# Patient Record
Sex: Female | Born: 1945 | Hispanic: No | Marital: Single | Smoking: Never smoker
Health system: Southern US, Community
[De-identification: ages and names within clinical notes are randomized; demographics above are authoritative.]

## PROBLEM LIST (undated history)

## (undated) DIAGNOSIS — N200 Calculus of kidney: Secondary | ICD-10-CM

## (undated) DIAGNOSIS — E119 Type 2 diabetes mellitus without complications: Secondary | ICD-10-CM

## (undated) DIAGNOSIS — I10 Essential (primary) hypertension: Secondary | ICD-10-CM

---

## 2000-11-28 HISTORY — PX: ABDOMINAL HYSTERECTOMY: SHX81

## 2015-11-18 ENCOUNTER — Other Ambulatory Visit: Payer: Self-pay | Admitting: Internal Medicine

## 2015-11-18 DIAGNOSIS — Z1231 Encounter for screening mammogram for malignant neoplasm of breast: Secondary | ICD-10-CM

## 2015-12-02 ENCOUNTER — Ambulatory Visit: Payer: Self-pay | Admitting: General Surgery

## 2015-12-03 ENCOUNTER — Ambulatory Visit
Admission: RE | Admit: 2015-12-03 | Discharge: 2015-12-03 | Disposition: A | Discharge status: Home / Self Care | Payer: Medicaid Other | Source: Ambulatory Visit | Attending: Internal Medicine | Admitting: Internal Medicine

## 2015-12-03 DIAGNOSIS — Z1231 Encounter for screening mammogram for malignant neoplasm of breast: Secondary | ICD-10-CM | POA: Diagnosis not present

## 2015-12-07 ENCOUNTER — Other Ambulatory Visit: Payer: Self-pay | Admitting: Internal Medicine

## 2015-12-07 DIAGNOSIS — R928 Other abnormal and inconclusive findings on diagnostic imaging of breast: Secondary | ICD-10-CM

## 2015-12-10 ENCOUNTER — Encounter: Payer: Self-pay | Admitting: General Surgery

## 2015-12-10 ENCOUNTER — Ambulatory Visit (INDEPENDENT_AMBULATORY_CARE_PROVIDER_SITE_OTHER): Payer: Medicaid Other | Admitting: General Surgery

## 2015-12-10 VITALS — BP 148/86 | HR 80 | Resp 12 | Ht 59.0 in | Wt 151.0 lb

## 2015-12-10 DIAGNOSIS — Z800 Family history of malignant neoplasm of digestive organs: Secondary | ICD-10-CM

## 2015-12-10 DIAGNOSIS — Z1211 Encounter for screening for malignant neoplasm of colon: Secondary | ICD-10-CM | POA: Diagnosis not present

## 2015-12-10 MED ORDER — POLYETHYLENE GLYCOL 3350 17 GM/SCOOP PO POWD: ORAL | Status: DC

## 2015-12-10 NOTE — Patient Instructions (Addendum)
Colonoscopy A colonoscopy is an exam to look at the entire large intestine (colon). This exam can help find problems such as tumors, polyps, inflammation, and areas of bleeding. The exam takes about 1 hour.  LET [facility] PROVIDER KNOW ABOUT:   Any allergies you have.  All medicines you are taking, including vitamins, herbs, eye drops, creams, and over-the-counter medicines.  Previous problems you or members of your family have had with the use of anesthetics.  Any blood disorders you have.  Previous surgeries you have had.  Medical conditions you have. RISKS AND COMPLICATIONS  Generally, this is a safe procedure. However, as with any procedure, complications can occur. Possible complications include:  Bleeding.  Tearing or rupture of the colon wall.  Reaction to medicines given during the exam.  Infection (rare). BEFORE THE PROCEDURE   Ask your health care provider about changing or stopping your regular medicines.  You may be prescribed an oral bowel prep. This involves drinking a large amount of medicated liquid, starting the day before your procedure. The liquid will cause you to have multiple loose stools until your stool is almost clear or light green. This cleans out your colon in preparation for the procedure.  Do not eat or drink anything else once you have started the bowel prep, unless your health care provider tells you it is safe to do so.  Arrange for someone to drive you home after the procedure. PROCEDURE   You will be given medicine to help you relax (sedative).  You will lie on your side with your knees bent.  A long, flexible tube with a light and camera on the end (colonoscope) will be inserted through the rectum and into the colon. The camera sends video back to a computer screen as it moves through the colon. The colonoscope also releases carbon dioxide gas to inflate the colon. This helps your health care provider see the area better.  During  the exam, your health care provider may take a small tissue sample (biopsy) to be examined under a microscope if any abnormalities are found.  The exam is finished when the entire colon has been viewed. AFTER THE PROCEDURE   Do not drive for 24 hours after the exam.  You may have a small amount of blood in your stool.  You may pass moderate amounts of gas and have mild abdominal cramping or bloating. This is caused by the gas used to inflate your colon during the exam.  Ask when your test results will be ready and how you will get your results. Make sure you get your test results.   This information is not intended to replace advice given to you by your health care provider. Make sure you discuss any questions you have with your health care provider.   Document Released: 11/11/2000 Document Revised: 09/04/2013 Document Reviewed: 07/22/2013 Elsevier Interactive Patient Education [employer]2016 [employer].     Patient [facility] Patient has been asked to hold Metformin and Kombiglize day of colonoscopy prep and procedure. She may take glipizide day of colonoscopy prep. Patient will only take Losartan day of colonoscopy at 6 am with a sip of water.

## 2015-12-10 NOTE — Progress Notes (Signed)
Patient ID: Jocelyn Vazquez, female   DOB: May 16, 1946, 70 y.o.   MRN: [id number]  Chief Complaint  Patient presents with  . Colonoscopy    HPI Jocelyn Vazquez is a 70 y.o. female.  Here today to discuss having a colonoscopy, none prior. Bowels move daily and no bleeding. Her mother had colon cancer at age 11. I have reviewed the history of present illness with the patient. Interpreter, Mahin, present for interview, exam and discussion.  HPI  History reviewed. No pertinent past medical history.  Past Surgical History  Procedure Laterality Date  . Abdominal hysterectomy  2002    Family History  Problem Relation Age of Onset  . Breast cancer Neg Hx   . Colon cancer Mother 55    Social History Social History  Substance Use Topics  . Smoking status: Never Smoker   . Smokeless tobacco: Never Used  . Alcohol Use: No    No Known Allergies  Current Outpatient Prescriptions  Medication Sig Dispense Refill  . aspirin 81 MG tablet Take 81 mg by mouth daily.    Marland Kitchen glipiZIDE (GLUCOTROL XL) 5 MG 24 hr tablet TK 1 T PO BID  0  . KOMBIGLYZE XR 03-999 MG TB24 TK 1 T PO QD  0  . losartan (COZAAR) 50 MG tablet TK 1 T PO QD  0  . lovastatin (MEVACOR) 20 MG tablet TK 1 T PO QD  0  . metFORMIN (GLUCOPHAGE) 1000 MG tablet TK 1 T PO QD  0   No current facility-administered medications for this visit.    Review of Systems Review of Systems  Constitutional: Negative.   Respiratory: Negative.   Cardiovascular: Negative.   Gastrointestinal: Negative for nausea, vomiting, diarrhea and constipation.    Blood pressure 148/86, pulse 80, resp. rate 12, height  (1.499 m), weight 151 lb (68.493 kg).  Physical Exam Physical Exam  Constitutional: She is oriented to person, place, and time. She appears well-developed and well-nourished.  HENT:  Mouth/Throat: Oropharynx is clear and moist.  Eyes: Conjunctivae are normal. No scleral icterus.  Neck: Neck supple.   Cardiovascular: Normal rate, regular rhythm and normal heart sounds.   Pulmonary/Chest: Effort normal and breath sounds normal.  Abdominal: Soft. Normal appearance and bowel sounds are normal. There is no tenderness.  Lymphadenopathy:    She has no cervical adenopathy.  Neurological: She is alert and oriented to person, place, and time.  Skin: Skin is warm and dry.  Psychiatric: Her behavior is normal.    Data Reviewed Progress notes.  Assessment    Stable physical exam. Family history of colon cancer. She appears to be in very good health, has higher than average risk of colon CA. Feel it is reasonable to perform colonoscopy. This was discussed fully with pt via interpreter. Pt is agreeable.    Plan    Colonoscopy with possible biopsy/polypectomy prn: Information regarding the procedure, including its potential risks and complications (including but not limited to perforation of the bowel, which may require emergency surgery to repair, and bleeding) was verbally given to the patient. Educational information regarding lower intestinal endoscopy was given to the patient. Written instructions for how to complete the bowel prep using Miralax were provided. The importance of drinking ample fluids to avoid dehydration as a result of the prep emphasized.     Patient has been scheduled for a colonoscopy on 01-06-16 at [facility]. Patient has been asked to hold Metformin and Kombiglize day of colonoscopy prep and procedure.  She may take glipizide day of colonoscopy prep. Patient will only take Losartan day of colonoscopy at 6 am with a sip of water.    PCP:  Sherrie MustacheJadali, Fayegh  This information has been scribed by Dorathy DaftMarsha Hatch RNBC.  SANKAR,SEEPLAPUTHUR G 12/10/2015, 9:34 AM

## 2016-01-06 ENCOUNTER — Encounter: Payer: Self-pay | Admitting: *Deleted

## 2016-01-06 ENCOUNTER — Ambulatory Visit
Admission: RE | Admit: 2016-01-06 | Discharge: 2016-01-06 | Disposition: A | Discharge status: Home / Self Care | Payer: Medicaid Other | Source: Ambulatory Visit | Attending: General Surgery | Admitting: General Surgery

## 2016-01-06 ENCOUNTER — Ambulatory Visit: Payer: Medicaid Other | Admitting: Anesthesiology

## 2016-01-06 ENCOUNTER — Encounter
Admission: RE | Disposition: A | Discharge status: Home / Self Care | Payer: Self-pay | Source: Ambulatory Visit | Attending: General Surgery

## 2016-01-06 DIAGNOSIS — Z7984 Long term (current) use of oral hypoglycemic drugs: Secondary | ICD-10-CM | POA: Diagnosis not present

## 2016-01-06 DIAGNOSIS — Z800 Family history of malignant neoplasm of digestive organs: Secondary | ICD-10-CM | POA: Diagnosis not present

## 2016-01-06 DIAGNOSIS — K644 Residual hemorrhoidal skin tags: Secondary | ICD-10-CM | POA: Insufficient documentation

## 2016-01-06 DIAGNOSIS — Z7982 Long term (current) use of aspirin: Secondary | ICD-10-CM | POA: Insufficient documentation

## 2016-01-06 DIAGNOSIS — Z90710 Acquired absence of both cervix and uterus: Secondary | ICD-10-CM | POA: Diagnosis not present

## 2016-01-06 DIAGNOSIS — Z1211 Encounter for screening for malignant neoplasm of colon: Secondary | ICD-10-CM | POA: Insufficient documentation

## 2016-01-06 HISTORY — DX: Type 2 diabetes mellitus without complications: E11.9

## 2016-01-06 HISTORY — DX: Calculus of kidney: N20.0

## 2016-01-06 HISTORY — DX: Essential (primary) hypertension: I10

## 2016-01-06 HISTORY — PX: COLONOSCOPY WITH PROPOFOL: SHX5780

## 2016-01-06 SURGERY — COLONOSCOPY WITH PROPOFOL
Anesthesia: General

## 2016-01-06 MED ORDER — PROPOFOL 10 MG/ML IV BOLUS
INTRAVENOUS | Status: DC | PRN
  Administered 2016-01-06: 50 mg via INTRAVENOUS

## 2016-01-06 MED ORDER — MIDAZOLAM HCL 5 MG/5ML IJ SOLN
INTRAMUSCULAR | Status: DC | PRN
  Administered 2016-01-06: 1 mg via INTRAVENOUS

## 2016-01-06 MED ORDER — PROPOFOL 500 MG/50ML IV EMUL
INTRAVENOUS | Status: DC | PRN
  Administered 2016-01-06: 140 ug/kg/min via INTRAVENOUS

## 2016-01-06 MED ORDER — FENTANYL CITRATE (PF) 100 MCG/2ML IJ SOLN
INTRAMUSCULAR | Status: DC | PRN
  Administered 2016-01-06: 50 ug via INTRAVENOUS

## 2016-01-06 MED ORDER — SODIUM CHLORIDE 0.9 % IV SOLN
INTRAVENOUS | Status: DC
  Administered 2016-01-06: 09:00:00 via INTRAVENOUS
  Administered 2016-01-06: 1000 mL via INTRAVENOUS

## 2016-01-06 NOTE — Anesthesia Preprocedure Evaluation (Signed)
Anesthesia Evaluation  Patient identified by MRN, date of birth, ID band Patient awake    Reviewed: Allergy & Precautions, NPO status , Patient's Chart, lab work & pertinent test results  Airway Mallampati: II  TM Distance: <3 FB Neck ROM: Full    Dental no notable dental hx. (+) Caps   Pulmonary neg pulmonary ROS,    Pulmonary exam normal breath sounds clear to auscultation       Cardiovascular hypertension, Normal cardiovascular exam     Neuro/Psych negative neurological ROS  negative psych ROS   GI/Hepatic negative GI ROS, Neg liver ROS, Patient received Oral Contrast Agents,  Endo/Other  diabetes, Well Controlled, Type 2, Oral Hypoglycemic Agents  Renal/GU stones  negative genitourinary   Musculoskeletal negative musculoskeletal ROS (+)   Abdominal Normal abdominal exam  (+)   Peds negative pediatric ROS (+)  Hematology negative hematology ROS (+)   Anesthesia Other Findings   Reproductive/Obstetrics                             Anesthesia Physical Anesthesia Plan  ASA: II  Anesthesia Plan: General   Post-op Pain Management:    Induction: Intravenous  Airway Management Planned: Nasal Cannula  Additional Equipment:   Intra-op Plan:   Post-operative Plan:   Informed Consent: I have reviewed the patients History and Physical, chart, labs and discussed the procedure including the risks, benefits and alternatives for the proposed anesthesia with the patient or authorized representative who has indicated his/her understanding and acceptance.   Dental advisory given  Plan Discussed with: CRNA and Surgeon  Anesthesia Plan Comments:         Anesthesia Quick Evaluation

## 2016-01-06 NOTE — Anesthesia Procedure Notes (Signed)
Date/Time: 01/06/2016 9:24 AM Performed by: Henrietta Hoover Pre-anesthesia Checklist: Patient identified, Emergency Drugs available, Suction available, Patient being monitored and Timeout performed Patient Re-evaluated:Patient Re-evaluated prior to inductionOxygen Delivery Method: Nasal cannula Preoxygenation: Pre-oxygenation with 100% oxygen Intubation Type: IV induction Comments: Spontaneous resp throughout

## 2016-01-06 NOTE — Transfer of Care (Signed)
Immediate Anesthesia Transfer of Care Note  Patient: Jocelyn Vazquez  Procedure(s) Performed: Procedure(s): COLONOSCOPY WITH PROPOFOL (N/A)  Patient Location: PACU  Anesthesia Type:General  Level of Consciousness: sedated  Airway & Oxygen Therapy: Patient Spontanous Breathing and Patient connected to nasal cannula oxygen  Post-op Assessment: Report given to RN and Post -op Vital signs reviewed and stable  Post vital signs: Reviewed and stable  Last Vitals:  Filed Vitals:   01/06/16 0835 01/06/16 0940  BP: 160/85   Pulse: 81   Temp: 36.2 C 36 C  Resp: 16     Complications: No apparent anesthesia complications

## 2016-01-06 NOTE — H&P (View-Only) (Signed)
Patient ID: Jocelyn Vazquez, female   DOB: May 16, 1946, 70 y.o.   MRN: [id number]  Chief Complaint  Patient presents with  . Colonoscopy    HPI Jocelyn Vazquez is a 70 y.o. female.  Here today to discuss having a colonoscopy, none prior. Bowels move daily and no bleeding. Her mother had colon cancer at age 11. I have reviewed the history of present illness with the patient. Interpreter, Mahin, present for interview, exam and discussion.  HPI  History reviewed. No pertinent past medical history.  Past Surgical History  Procedure Laterality Date  . Abdominal hysterectomy  2002    Family History  Problem Relation Age of Onset  . Breast cancer Neg Hx   . Colon cancer Mother 55    Social History Social History  Substance Use Topics  . Smoking status: Never Smoker   . Smokeless tobacco: Never Used  . Alcohol Use: No    No Known Allergies  Current Outpatient Prescriptions  Medication Sig Dispense Refill  . aspirin 81 MG tablet Take 81 mg by mouth daily.    Marland Kitchen glipiZIDE (GLUCOTROL XL) 5 MG 24 hr tablet TK 1 T PO BID  0  . KOMBIGLYZE XR 03-999 MG TB24 TK 1 T PO QD  0  . losartan (COZAAR) 50 MG tablet TK 1 T PO QD  0  . lovastatin (MEVACOR) 20 MG tablet TK 1 T PO QD  0  . metFORMIN (GLUCOPHAGE) 1000 MG tablet TK 1 T PO QD  0   No current facility-administered medications for this visit.    Review of Systems Review of Systems  Constitutional: Negative.   Respiratory: Negative.   Cardiovascular: Negative.   Gastrointestinal: Negative for nausea, vomiting, diarrhea and constipation.    Blood pressure 148/86, pulse 80, resp. rate 12, height  (1.499 m), weight 151 lb (68.493 kg).  Physical Exam Physical Exam  Constitutional: She is oriented to person, place, and time. She appears well-developed and well-nourished.  HENT:  Mouth/Throat: Oropharynx is clear and moist.  Eyes: Conjunctivae are normal. No scleral icterus.  Neck: Neck supple.   Cardiovascular: Normal rate, regular rhythm and normal heart sounds.   Pulmonary/Chest: Effort normal and breath sounds normal.  Abdominal: Soft. Normal appearance and bowel sounds are normal. There is no tenderness.  Lymphadenopathy:    She has no cervical adenopathy.  Neurological: She is alert and oriented to person, place, and time.  Skin: Skin is warm and dry.  Psychiatric: Her behavior is normal.    Data Reviewed Progress notes.  Assessment    Stable physical exam. Family history of colon cancer. She appears to be in very good health, has higher than average risk of colon CA. Feel it is reasonable to perform colonoscopy. This was discussed fully with pt via interpreter. Pt is agreeable.    Plan    Colonoscopy with possible biopsy/polypectomy prn: Information regarding the procedure, including its potential risks and complications (including but not limited to perforation of the bowel, which may require emergency surgery to repair, and bleeding) was verbally given to the patient. Educational information regarding lower intestinal endoscopy was given to the patient. Written instructions for how to complete the bowel prep using Miralax were provided. The importance of drinking ample fluids to avoid dehydration as a result of the prep emphasized.     Patient has been scheduled for a colonoscopy on 01-06-16 at [facility]. Patient has been asked to hold Metformin and Kombiglize day of colonoscopy prep and procedure.  She may take glipizide day of colonoscopy prep. Patient will only take Losartan day of colonoscopy at 6 am with a sip of water.    PCP:  Sherrie Mustache  This information has been scribed by Dorathy Daft RNBC.  SANKAR,SEEPLAPUTHUR G 12/10/2015, 9:34 AM

## 2016-01-06 NOTE — Interval H&P Note (Signed)
History and Physical Interval Note:  01/06/2016 8:26 AM  Jocelyn Vazquez  has presented today for surgery, with the diagnosis of FAM HX  The various methods of treatment have been discussed with the patient and family. After consideration of risks, benefits and other options for treatment, the patient has consented to  Procedure(s): COLONOSCOPY WITH PROPOFOL (N/A) as a surgical intervention .  The patient's history has been reviewed, patient examined, no change in status, stable for surgery.  I have reviewed the patient's chart and labs.  Questions were answered to the patient's satisfaction.     SANKAR,SEEPLAPUTHUR G

## 2016-01-06 NOTE — Op Note (Signed)
[facility] Gastroenterology Patient Name: Jocelyn Vazquez [facility] Procedure Date: 01/06/2016 9:07 AM MRN: [id number] Account #: [id number] Date of Birth: [birth year] Admit Type: Outpatient Age: 70 Room: [facility] ENDO ROOM 1 Gender: Female Note Status: Finalized Procedure:         Colonoscopy Indications:       Screening in patient at increased risk: Family history of                     1st-degree relative with colorectal cancer before age 33                     years Providers:         Seeplaputhur G. Evette Cristal, MD Referring MD:      Sherrie Mustache, MD (Referring MD) Medicines:         General Anesthesia Complications:     No immediate complications. Procedure:         Pre-Anesthesia Assessment:                    - General anesthesia under the supervision of an                     anesthesiologist was determined to be medically necessary                     for this procedure based on review of the patient's                     medical history, medications, and prior anesthesia history.                    After obtaining informed consent, the colonoscope was                     passed under direct vision. Throughout the procedure, the                     patient's blood pressure, pulse, and oxygen saturations                     were monitored continuously. The Olympus CF-Q160AL                     colonoscope (S#. [id number]) was introduced through the anus                     and advanced to the the cecum, identified by the ileocecal                     valve. The colonoscopy was performed without difficulty.                     The patient tolerated the procedure well. The quality of                     the bowel preparation was excellent. Findings:      The perianal exam findings include non-thrombosed external hemorrhoids.      The exam was otherwise without abnormality on direct and retroflexion       views. Impression:        - Non-thrombosed external hemorrhoids  found on perianal                     exam.                    -  The examination was otherwise normal on direct and                     retroflexion views.                    - No specimens collected. Recommendation:    - Discharge patient to home.                    - Repeat colonoscopy in 5 years for surveillance. Procedure Code(s): --- Professional ---                    [phone], Colonoscopy, flexible; diagnostic, including                     collection of specimen(s) by brushing or washing, when                     performed (separate procedure) Diagnosis Code(s): --- Professional ---                    Z80.0, Family history of malignant neoplasm of digestive                     organs                    K64.4, Residual hemorrhoidal skin tags CPT copyright 2014 American Medical Association. All rights reserved. The codes documented in this report are preliminary and upon coder review may  be revised to meet current compliance requirements. Kieth Brightly, MD 01/06/2016 9:40:33 AM This report has been signed electronically. Number of Addenda: 0 Note Initiated On: 01/06/2016 9:07 AM Scope Withdrawal Time: 0 hours 4 minutes 41 seconds  Total Procedure Duration: 0 hours 23 minutes 1 second       [facility]

## 2016-01-07 NOTE — Anesthesia Postprocedure Evaluation (Signed)
Anesthesia Post Note  Patient: Jocelyn Vazquez  Procedure(s) Performed: Procedure(s) (LRB): COLONOSCOPY WITH PROPOFOL (N/A)  Patient location during evaluation: PACU Anesthesia Type: General Level of consciousness: awake and alert Pain management: pain level controlled Vital Signs Assessment: post-procedure vital signs reviewed and stable Respiratory status: spontaneous breathing Cardiovascular status: blood pressure returned to baseline Anesthetic complications: no    Last Vitals:  Filed Vitals:   01/06/16 1007 01/06/16 1010  BP: 148/87 148/75  Pulse: 71 72  Temp:    Resp: 16 14    Last Pain:  Filed Vitals:   01/07/16 0732  PainSc: 0-No pain                 Kerry-Anne Mezo

## 2016-01-18 ENCOUNTER — Ambulatory Visit
Admission: RE | Admit: 2016-01-18 | Discharge: 2016-01-18 | Disposition: A | Discharge status: Home / Self Care | Payer: Medicaid Other | Source: Ambulatory Visit | Attending: Internal Medicine | Admitting: Internal Medicine

## 2016-01-18 DIAGNOSIS — R590 Localized enlarged lymph nodes: Secondary | ICD-10-CM | POA: Insufficient documentation

## 2016-01-18 DIAGNOSIS — R928 Other abnormal and inconclusive findings on diagnostic imaging of breast: Secondary | ICD-10-CM | POA: Insufficient documentation

## 2016-01-19 ENCOUNTER — Other Ambulatory Visit: Payer: Self-pay | Admitting: Internal Medicine

## 2016-01-19 DIAGNOSIS — N6320 Unspecified lump in the left breast, unspecified quadrant: Secondary | ICD-10-CM

## 2016-01-28 ENCOUNTER — Ambulatory Visit
Admission: RE | Admit: 2016-01-28 | Discharge: 2016-01-28 | Disposition: A | Discharge status: Home / Self Care | Payer: Medicaid Other | Source: Ambulatory Visit | Attending: Internal Medicine | Admitting: Internal Medicine

## 2016-01-28 DIAGNOSIS — N6320 Unspecified lump in the left breast, unspecified quadrant: Secondary | ICD-10-CM

## 2016-01-28 DIAGNOSIS — N63 Unspecified lump in breast: Secondary | ICD-10-CM | POA: Insufficient documentation

## 2016-01-28 DIAGNOSIS — R591 Generalized enlarged lymph nodes: Secondary | ICD-10-CM | POA: Diagnosis not present

## 2016-01-28 HISTORY — PX: BREAST BIOPSY: SHX20

## 2016-01-29 LAB — SURGICAL PATHOLOGY

## 2016-12-13 IMAGING — MG MM DIGITAL SCREENING BILATERAL
4 series · 4 of 4 positions shown · non-contrast
Comparison: None available.

CLINICAL DATA: Screening.

EXAM:
DIGITAL SCREENING BILATERAL MAMMOGRAM WITH CAD

[L CC]
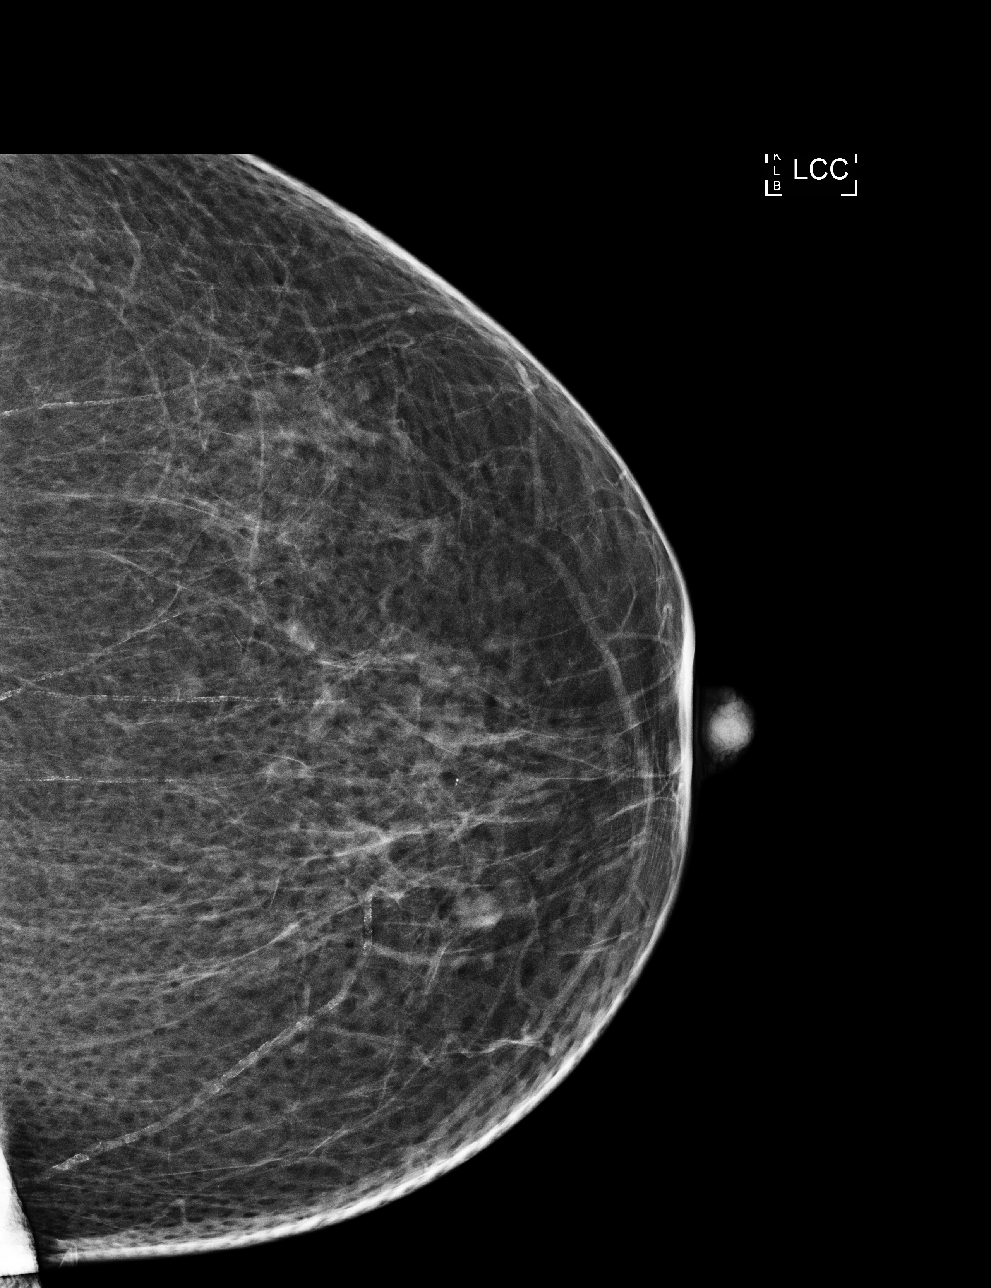

[R CC]
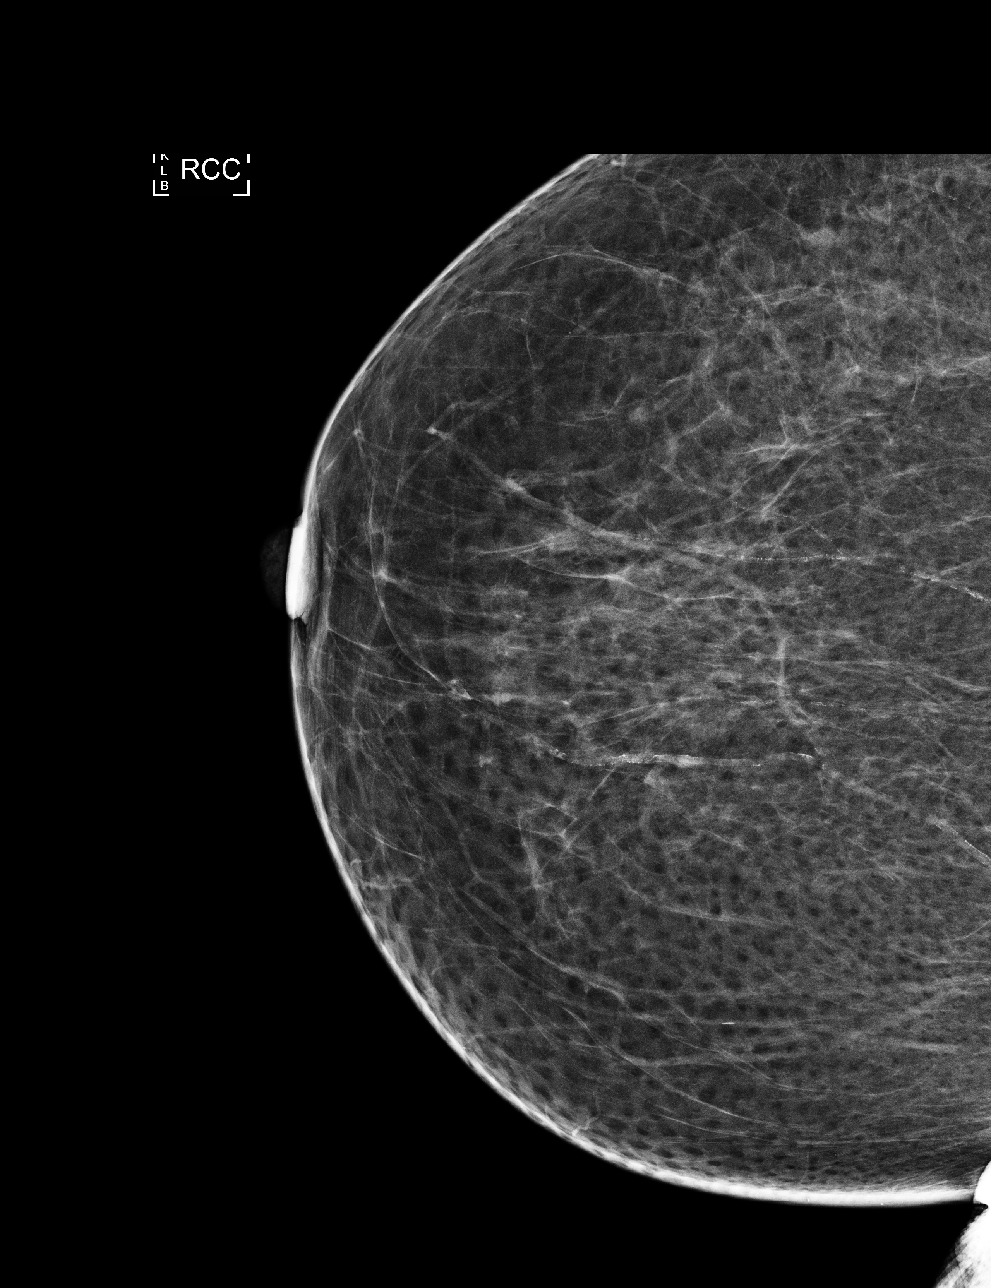

[L MLO]
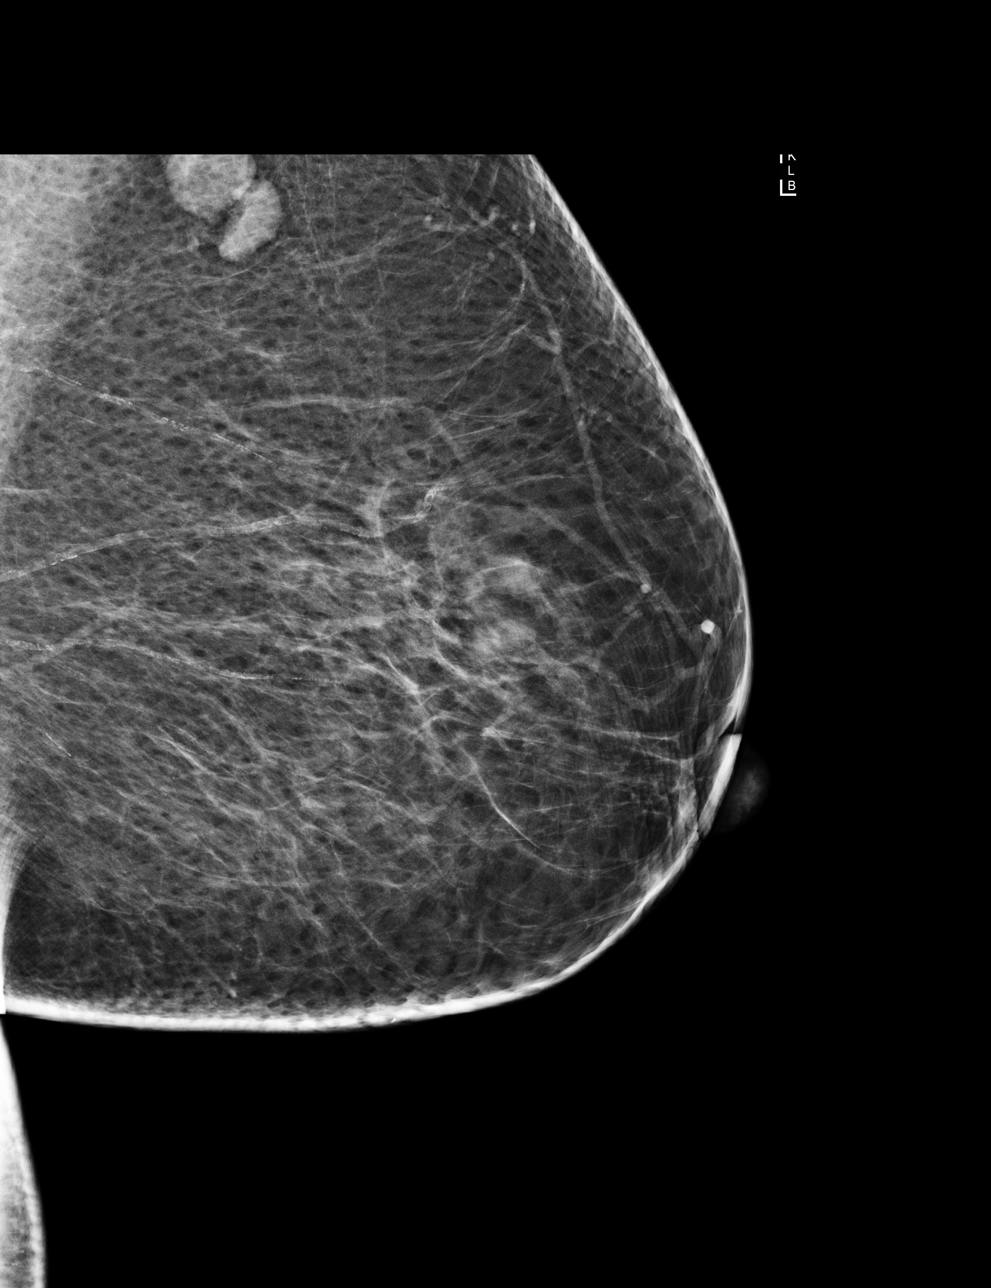

[R MLO]
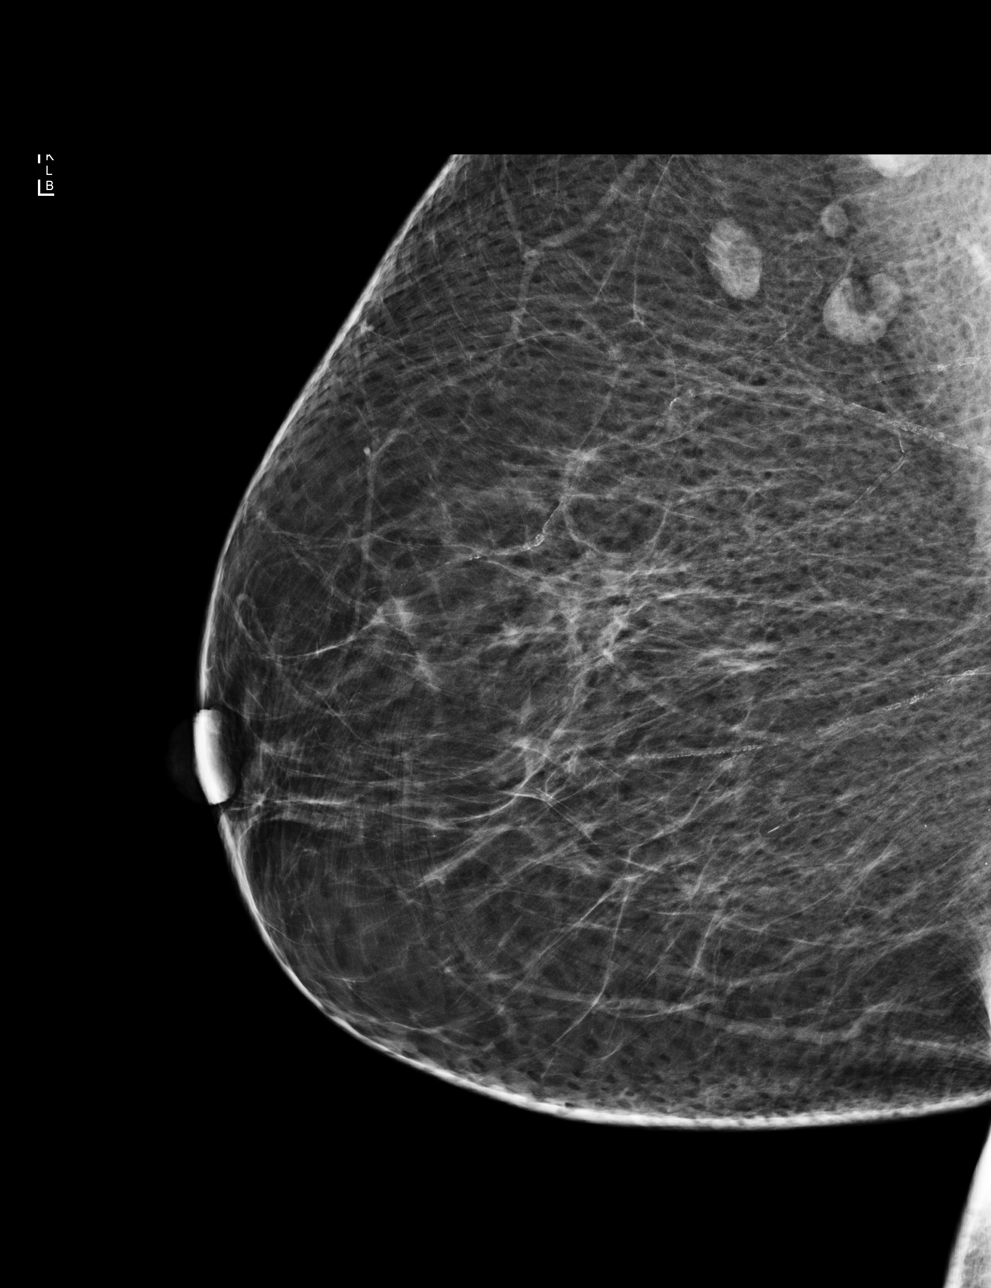

[4 of 4 positions shown; findings below may reference images not displayed]

ACR Breast Density Category b: There are scattered areas of
fibroglandular density.
FINDINGS: In the left breast, a possible mass warrants further evaluation. In
the right breast, no findings suspicious for malignancy.

Images were processed with CAD.
IMPRESSION: Further evaluation is suggested for possible mass in the left
breast.

RECOMMENDATION:
Diagnostic mammogram and possibly ultrasound of the left breast.
(Code:ID-S-IIZ)

The patient will be contacted regarding the findings, and additional
imaging will be scheduled.

BI-RADS CATEGORY  0: Incomplete. Need additional imaging evaluation
and/or prior mammograms for comparison.

## 2017-06-25 IMAGING — US US BREAST*L* LIMITED INC AXILLA
1 series · 13 of 25 positions shown · non-contrast
Comparison: Screening mammogram dated 12/03/2015

CLINICAL DATA: 69-year-old female, callback from screening
mammogram for possible left breast mass

EXAM:
DIGITAL DIAGNOSTIC LEFT MAMMOGRAM WITH 3D TOMOSYNTHESIS WITH CAD
ULTRASOUND LEFT BREAST

[Series 1: us breast*left* limited inc axilla · 0.08mm/px · 13 of 26 slices shown]
[im 1/26]
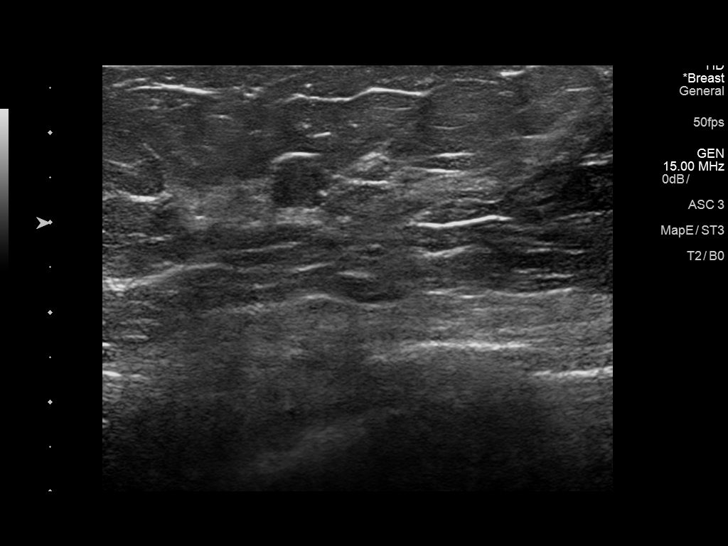
[im 3/26]
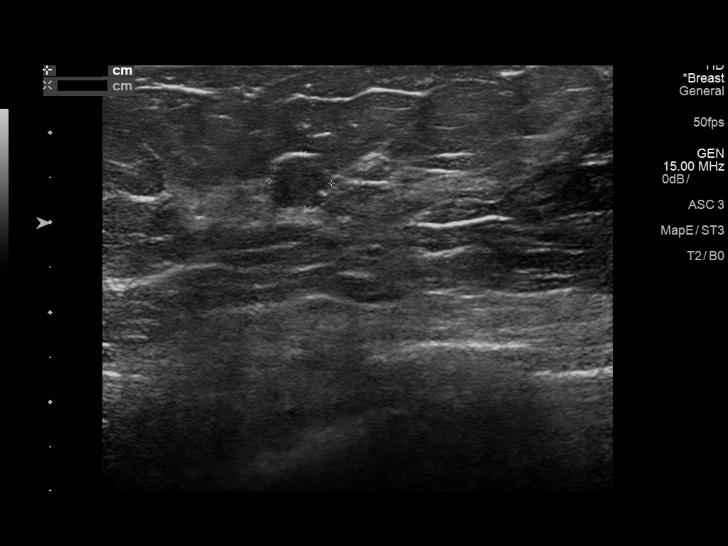
[im 5/26]
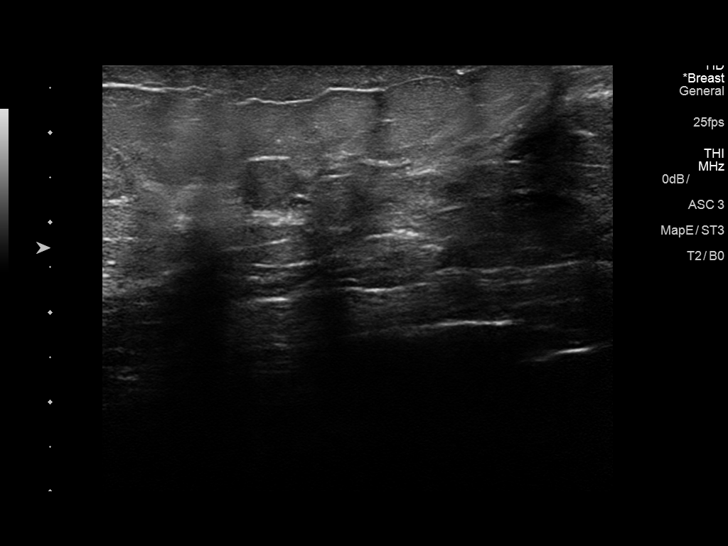
[im 7/26]
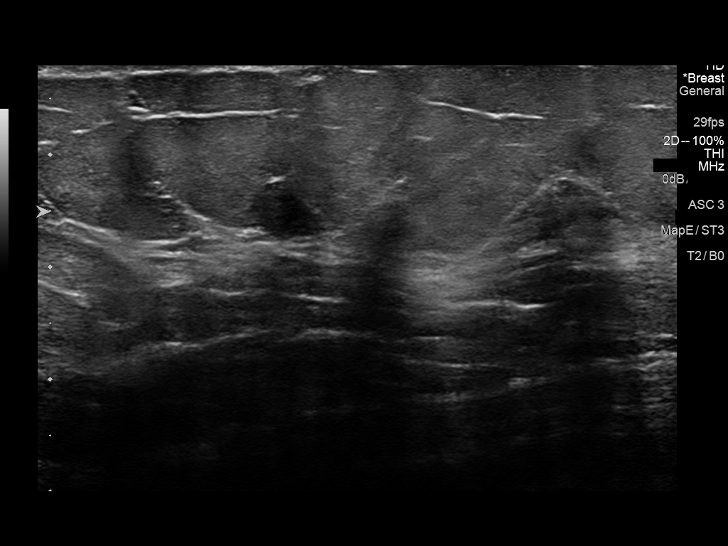
[im 9/26]
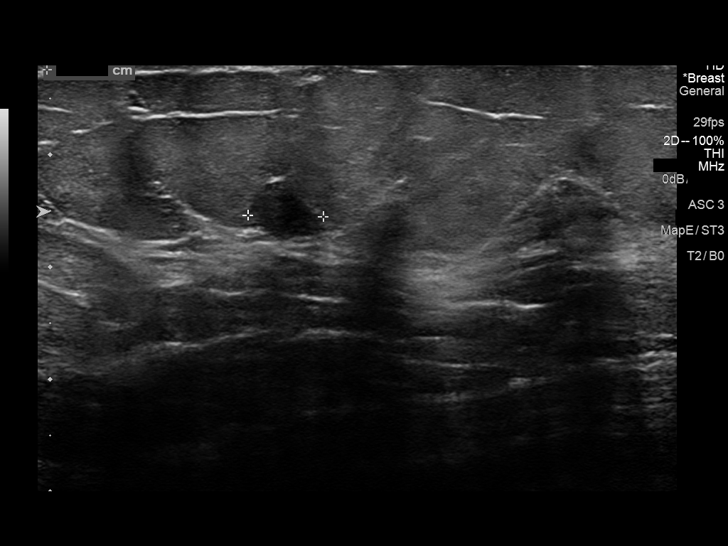
[im 11/26]
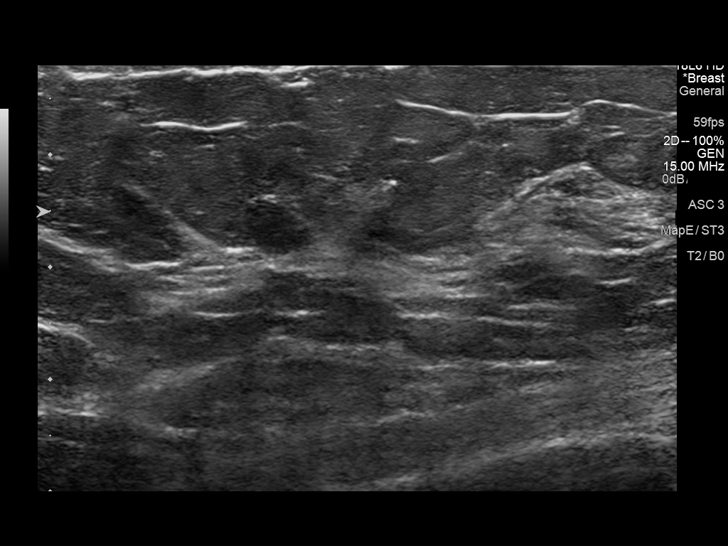
[im 13/26]
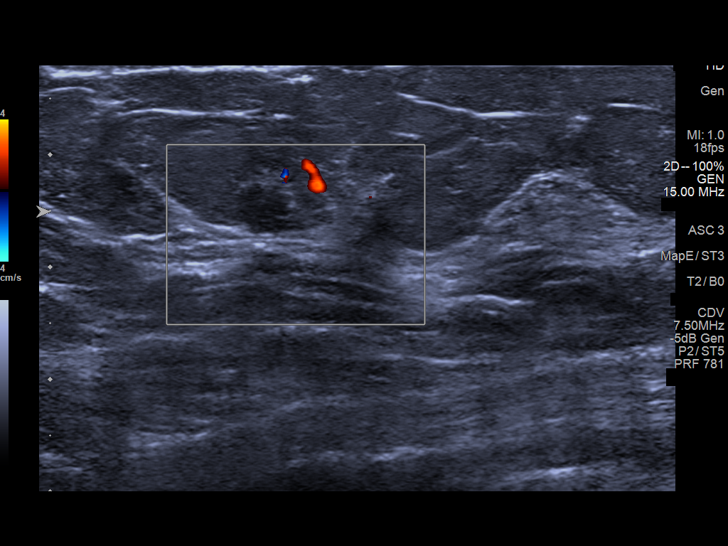
[im 15/26]
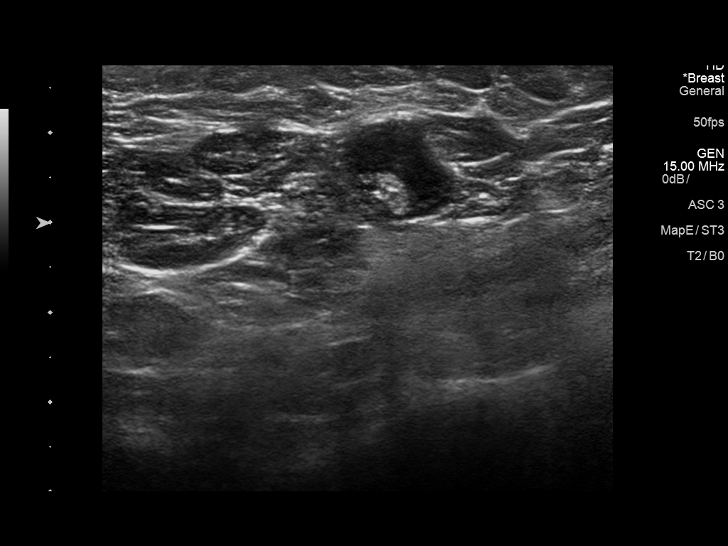
[im 17/26]
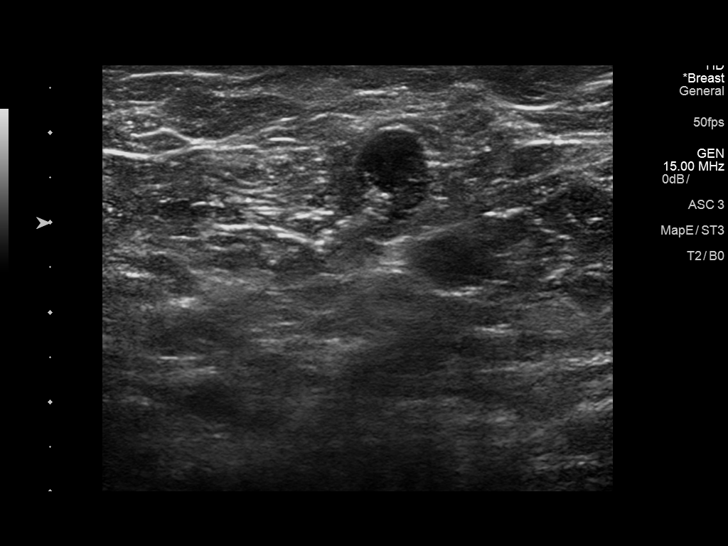
[im 19/26]
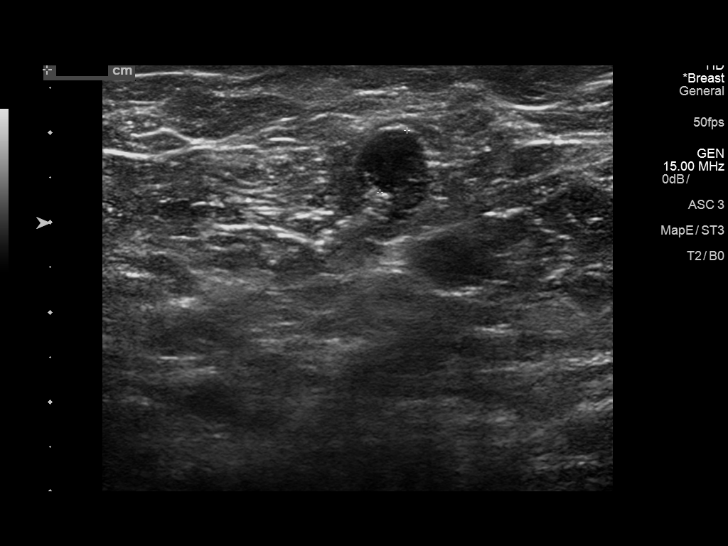
[im 21/26]
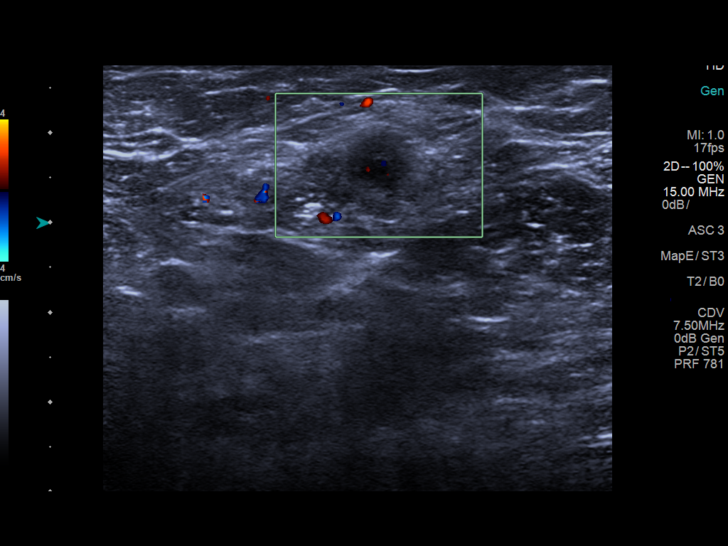
[im 23/26]
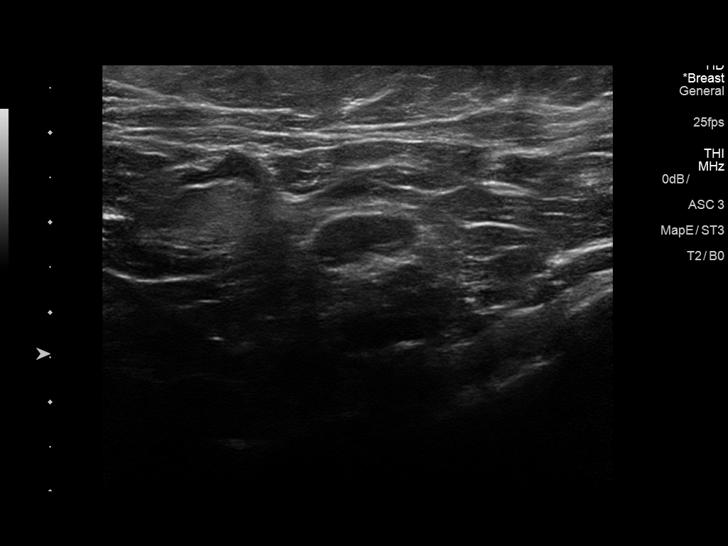
[im 26/26]
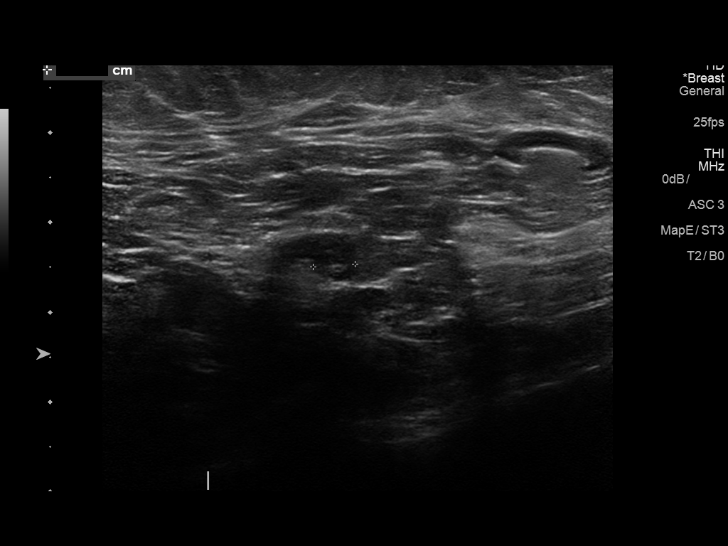

[13 of 25 positions shown; findings below may reference images not displayed]

ACR Breast Density Category c: The breast tissue is heterogeneously
dense, which may obscure small masses.
FINDINGS: CC and MLO views of the left breast with tomosynthesis were
performed. On the additional views, there is an oval, circumscribed
mass within the medial left breast, middle depth, corresponding to
the possible abnormality identified on screening mammogram. In
addition, prominent lymph nodes are identified within the left
axilla.

Mammographic images were processed with CAD.

On physical exam, no discrete mass is felt in the area of concern
within the medial left breast.

Targeted ultrasound is performed, showing an oval, hypoechoic mass
at 10 o'clock, 4 cm from the nipple with slightly irregular margins
measuring 7 x 6 x 7 mm, corresponding to the mammographic finding.
Within the left axilla, there are several lymph nodes demonstrating
cortical thickening, measuring up to 8 mm. Targeted ultrasound of
the right axilla was performed for comparison purposes. Multiple
normal appearing right axillary lymph nodes were identified. A
single lymph node demonstrating cortical thickening up to 5 mm was
seen.
IMPRESSION: 1. Indeterminate left breast mass at 10 o'clock, 4 cm from the
nipple.
2. Bilateral axillary adenopathy, left greater than right.

RECOMMENDATION:
1. Ultrasound guided biopsy of the left breast mass at 10 o'clock, 4
cm from the nipple.
2. Ultrasound-guided biopsy of an enlarged left axillary lymph node.

I have discussed the findings and recommendations with the patient
and her son through the use of an interpreter. Results were also
provided in writing at the conclusion of the visit. If applicable, a
reminder letter will be sent to the patient regarding the next
appointment.

BI-RADS CATEGORY  4: Suspicious.

## 2020-05-07 ENCOUNTER — Other Ambulatory Visit: Payer: Self-pay | Admitting: Internal Medicine

## 2020-05-07 DIAGNOSIS — R928 Other abnormal and inconclusive findings on diagnostic imaging of breast: Secondary | ICD-10-CM

## 2020-05-22 ENCOUNTER — Encounter: Payer: Self-pay | Admitting: *Deleted

## 2020-06-17 ENCOUNTER — Other Ambulatory Visit: Payer: Self-pay

## 2020-06-17 ENCOUNTER — Ambulatory Visit (INDEPENDENT_AMBULATORY_CARE_PROVIDER_SITE_OTHER): Payer: Medicaid Other | Admitting: Gastroenterology

## 2020-06-17 VITALS — BP 128/79 | HR 89 | Ht 59.0 in | Wt 144.0 lb

## 2020-06-17 DIAGNOSIS — Z1211 Encounter for screening for malignant neoplasm of colon: Secondary | ICD-10-CM | POA: Diagnosis not present

## 2020-06-17 DIAGNOSIS — R1013 Epigastric pain: Secondary | ICD-10-CM

## 2020-06-17 DIAGNOSIS — R140 Abdominal distension (gaseous): Secondary | ICD-10-CM

## 2020-06-17 MED ORDER — NA SULFATE-K SULFATE-MG SULF 17.5-3.13-1.6 GM/177ML PO SOLN: ORAL | 0 refills | Status: DC

## 2020-06-17 NOTE — Patient Instructions (Signed)
Low-FODMAP Eating Plan  FODMAPs (fermentable oligosaccharides, disaccharides, monosaccharides, and polyols) are sugars that are hard for some people to digest. A low-FODMAP eating plan may help some people who have bowel (intestinal) diseases to manage their symptoms. This meal plan can be complicated to follow. Work with a diet and nutrition specialist (dietitian) to make a low-FODMAP eating plan that is right for you. A dietitian can make sure that you get enough nutrition from this diet. What are tips for following this plan? Reading food labels  Check labels for hidden FODMAPs such as: ? High-fructose syrup. ? Honey. ? Agave. ? Natural fruit flavors. ? Onion or garlic powder.  Choose low-FODMAP foods that contain 3-4 grams of fiber per serving.  Check food labels for serving sizes. Eat only one serving at a time to make sure FODMAP levels stay low. Meal planning  Follow a low-FODMAP eating plan for up to 6 weeks, or as told by your health care provider or dietitian.  To follow the eating plan: 1. Eliminate high-FODMAP foods from your diet completely. 2. Gradually reintroduce high-FODMAP foods into your diet one at a time. Most people should wait a few days after introducing one high-FODMAP food before they introduce the next high-FODMAP food. Your dietitian can recommend how quickly you may reintroduce foods. 3. Keep a daily record of what you eat and drink, and make note of any symptoms that you have after eating. 4. Review your daily record with a dietitian regularly. Your dietitian can help you identify which foods you can eat and which foods you should avoid. General tips  Drink enough fluid each day to keep your urine pale yellow.  Avoid processed foods. These often have added sugar and may be high in FODMAPs.  Avoid most dairy products, whole grains, and sweeteners.  Work with a dietitian to make sure you get enough fiber in your diet. Recommended  foods Grains  Gluten-free grains, such as rice, oats, buckwheat, quinoa, corn, polenta, and millet. Gluten-free pasta, bread, or cereal. Rice noodles. Corn tortillas. Vegetables  Eggplant, zucchini, cucumber, peppers, green beans, Brussels sprouts, bean sprouts, lettuce, arugula, kale, Swiss chard, spinach, collard greens, bok choy, summer squash, potato, and tomato. Limited amounts of corn, carrot, and sweet potato. Green parts of scallions. Fruits  Bananas, oranges, lemons, limes, blueberries, raspberries, strawberries, grapes, cantaloupe, honeydew melon, kiwi, papaya, passion fruit, and pineapple. Limited amounts of dried cranberries, banana chips, and shredded coconut. Dairy  Lactose-free milk, yogurt, and kefir. Lactose-free cottage cheese and ice cream. Non-dairy milks, such as almond, coconut, hemp, and rice milk. Yogurts made of non-dairy milks. Limited amounts of goat cheese, brie, mozzarella, parmesan, swiss, and other hard cheeses. Meats and other protein foods  Unseasoned beef, pork, poultry, or fish. Eggs. Bacon. Tofu (firm) and tempeh. Limited amounts of nuts and seeds, such as almonds, walnuts, brazil nuts, pecans, peanuts, pumpkin seeds, chia seeds, and sunflower seeds. Fats and oils  Butter-free spreads. Vegetable oils, such as olive, canola, and sunflower oil. Seasoning and other foods  Artificial sweeteners with names that do not end in "ol" such as aspartame, saccharine, and stevia. Maple syrup, white table sugar, raw sugar, brown sugar, and molasses. Fresh basil, coriander, parsley, rosemary, and thyme. Beverages  Water and mineral water. Sugar-sweetened soft drinks. Small amounts of orange juice or cranberry juice. Black and green tea. Most dry wines. Coffee. This may not be a complete list of low-FODMAP foods. Talk with your dietitian for more information. Foods to avoid Grains  Wheat,   including kamut, durum, and semolina. Barley and bulgur. Couscous. Wheat-based  cereals. Wheat noodles, bread, crackers, and pastries. Vegetables  Chicory root, artichoke, asparagus, cabbage, snow peas, sugar snap peas, mushrooms, and cauliflower. Onions, garlic, leeks, and the white part of scallions. Fruits  Fresh, dried, and juiced forms of apple, pear, watermelon, peach, plum, cherries, apricots, blackberries, boysenberries, figs, nectarines, and mango. Avocado. Dairy  Milk, yogurt, ice cream, and soft cheese. Cream and sour cream. Milk-based sauces. Custard. Meats and other protein foods  Fried or fatty meat. Sausage. Cashews and pistachios. Soybeans, baked beans, black beans, chickpeas, kidney beans, fava beans, navy beans, lentils, and split peas. Seasoning and other foods  Any sugar-free gum or candy. Foods that contain artificial sweeteners such as sorbitol, mannitol, isomalt, or xylitol. Foods that contain honey, high-fructose corn syrup, or agave. Bouillon, vegetable stock, beef stock, and chicken stock. Garlic and onion powder. Condiments made with onion, such as hummus, chutney, pickles, relish, salad dressing, and salsa. Tomato paste. Beverages  Chicory-based drinks. Coffee substitutes. Chamomile tea. Fennel tea. Sweet or fortified wines such as port or sherry. Diet soft drinks made with isomalt, mannitol, maltitol, sorbitol, or xylitol. Apple, pear, and mango juice. Juices with high-fructose corn syrup. This may not be a complete list of high-FODMAP foods. Talk with your dietitian to discuss what dietary choices are best for you.  Summary  A low-FODMAP eating plan is a short-term diet that eliminates FODMAPs from your diet to help ease symptoms of certain bowel diseases.  The eating plan usually lasts up to 6 weeks. After that, high-FODMAP foods are restarted gradually, one at a time, so you can find out which may be causing symptoms.  A low-FODMAP eating plan can be complicated. It is best to work with a dietitian who has experience with this type of  plan. This information is not intended to replace advice given to you by your health care provider. Make sure you discuss any questions you have with your health care provider. Document Revised: 10/27/2017 Document Reviewed: 07/11/2017 Elsevier Patient Education  2020 Elsevier Inc.    FODMAP (?~??  ?  ? ~?  ~?  ? ) ??  ~   ? ?    . ?~  ?? FODMAP ~ ~   ?     ?? ?  () ~~ ~     ?? ~. ? ?  ?? ?  ?? .  ?~  Z?  ? ( ?) ~? ~?  ?~  ?? FODMAP ~     ? ~. ?~  ? ?  ?  ~ ~  ? Z?   ~? ? ? ~?. ~? ? ??  ?  ?   ?   ? FODMAP ? ?  ? ~? :   ~ . .    ? ? ??.  ? ? ?. ? ~ FODMAP ? 3-4  ?      ~?.  ?  ??  ?  ?  ? ~?.    ?~  ?   ?  FODMAP ?? .  ??  ?~  ?? FODMAP ~  6   ~?  ?   ?    ? ? ? Z? ?. ?  ~  ??: ?  FODMAP   Z?  ~  ~?.  ? ? ? FODMAP  ?~    ??   ~?. ?  ?     ? ?~ ?  FODMAP    ? ?  FODMAP   ? ~.  ? ?    ?  ~   ? ~    ??   ~?.   ~ ? ?  ? ?    ~?  ?  ~    ?  ? ~?.       ?~  ?  ~?.  ? ?    ~~ ~ ??  ~ ? ? ?   ~  ? ? ~?. ~ ~?   ? ~? ?    ~   .  ? ?  ? ~?. ?       ~  ?  ??  FODMAP .   ?  ?      ?? ~  ? ~?. ? ?   ? ~?  Z? ??   ?~  ? ~? ~?. ? ??              ?  ~?      . ~?   ?   .  ? . ? . ?   ~   ?    ??    ~   ?  ~  ?  ~ ?   ?    ? ??   ?  ~ ??  ? ??   ?. ?    ?  ? ?? ??. ?  ?. ?      ?  ?  ?  ~   ?    ?     ~??  ?  ?   . ?    ~  ?    ?  . ? ?    ~?  ~. ?  ?  ?  ~. ?? ? ?     ?  ~  ? .  ?    ?? ? ?. ? ?  ?   ?      ?  ? ?? .   ? ? ??     ~    ? ?  .   ?~.  (~)  . ? ?  ?          ? ?     ??   ~    ?    . ?     ?  ~.  ? ??   ?  ~   . ?  ?  ?? ~ ? ?   ?? ~  "ol"  ?     ?  ?.    ~  ?  ~   ~  ?   . ?   ?  ?  ?   ?. ??     ?.  ? ??   ~. ? ~?   ?   . ? ?  . ? ? ~. . ? ~  ? ~?  ? ~ FODMAP . ? ~  ?   ?    ~?. ?? ~ ?    ~?         .   .  ? ~~.  ? .  ?    ~~  ??? ? . ? ? ~?  ~    ~   ?   ?      ~. ?  ?   ?   ? ?. ?  ~   ~   ?  ?        ?     ?     ?  ?  . ~ ? ?    ?  ? .    .  ? ?. ?.   ? ? ??    ? . ?  ?  . ?? ?  ??   ?? ?    ?? ~?  ??   ?? ???      . ?  ?     ?    . ?? ~ ? ?? ~ ? ?  ?  ?  ? ? ?? . ?? ~ ?      ~  ?  .   ?   ?       .  ?  ?. ? ?? ~  ?  ?      ?  ?  ?     .   ?. ??  ?? ? ? ~?. ?? ? . ? . ? ?.  ? ?? ? ?    ? ?.  ?  Z??    ?  ?  ?  ? ? ??.  ?  ?  .  ? ??     ~ . ? ~  ? ~?  ?  FODMAP .   ?   ~?     ? Z? ?? ?    ~.  ?~  ?? FODMAP ~  ?~ Z? ?? ~   ~ FODMAP   Z?   ? ~    ? ?? ?  ~~ ~.  ??   6   ? ~.     ?  FODMAP  ?  ?~ ?~   ~ ? ~  ? ? ? ? ~  ?? ? ? ~. ?~  ?? FODMAP ~ ?  ?? . ? ~ ?  ~  ?~  ? ~  ?      ~  ~?. ?    ???     ?  ?. ?  ~? ~  ?      ?   ? ? ?.   : 2017/10/27  ? : 1396/07/11  ?  ?  [employer].

## 2020-06-17 NOTE — Addendum Note (Signed)
Addended by: Adela Ports on: 06/17/2020 09:21 AM   Modules accepted: Orders

## 2020-06-17 NOTE — Progress Notes (Signed)
Wyline Mood MD, MRCP(U.K) [address]  [address]  [place], [place] [address]  Main: [phone]  Fax: [phone]   Gastroenterology Consultation  Referring Provider:     Sherrie Mustache, MD Primary Care Physician:  Sherrie Mustache, MD Primary Gastroenterologist:  Dr. Wyline Mood  Reason for Consultation: Abdominal bloating        HPI:   Jocelyn Vazquez is a 74 y.o. y/o female referred for consultation & management  by Dr. Sherrie Mustache, MD.    She has been referred for abdominal bloating.  Last colonoscopy was back in 2017 by Dr. Evette Cristal no polyps are noted.  No recent labs available on epic. She is here today with an interpreter.  She speaks only [place].  She states that she has had issues with abdominal bloating for a few months.  On and off.  No increase in troponins.  No change in bowel habits.  Has a bowel movement daily.  Not very hard.  No other complaints.  Denies any use of artificial sweeteners.  Some upper abdominal discomfort at times.  Apparently she is due for a colonoscopy.  She would like to schedule one.  She has been on Metformin 1500 mg a day.  Previously was on 2000 mg a day.  Had a lot of upper GI symptoms and was decreased.   Past Medical History:  Diagnosis Date  . Diabetes mellitus without complication (HCC)   . Hypertension   . Kidney stones     Past Surgical History:  Procedure Laterality Date  . ABDOMINAL HYSTERECTOMY  2002  . BREAST BIOPSY Left 01/28/2016   breast and lymph US guided biopsy  . COLONOSCOPY WITH PROPOFOL N/A 01/06/2016   Procedure: COLONOSCOPY WITH PROPOFOL;  Surgeon: Kieth Brightly, MD;  Location: [facility] ENDOSCOPY;  Service: Endoscopy;  Laterality: N/A;    Prior to Admission medications   Medication Sig Start Date End Date Taking? Authorizing Provider  aspirin 81 MG tablet Take 81 mg by mouth daily.    [provider]  glipiZIDE (GLUCOTROL XL) 5 MG 24 hr tablet TK 1 T PO BID 09/30/15   [provider]  KOMBIGLYZE XR 03-999 MG TB24 TK 1 T PO QD 12/03/15   [provider]  losartan (COZAAR) 50 MG tablet TK 1 T PO QD 09/29/15   [provider]  lovastatin (MEVACOR) 20 MG tablet TK 1 T PO QD 09/29/15   [provider]  metFORMIN (GLUCOPHAGE) 1000 MG tablet TK 1 T PO QD 09/29/15   [provider]  polyethylene glycol powder (GLYCOLAX/MIRALAX) powder 255 grams one bottle for colonoscopy prep 12/10/15   Kieth Brightly, MD    Family History  Problem Relation Age of Onset  . Breast cancer Neg Hx   . Colon cancer Mother 80     Social History   Tobacco Use  . Smoking status: Never Smoker  . Smokeless tobacco: Never Used  Substance Use Topics  . Alcohol use: No    Alcohol/week: 0.0 standard drinks  . Drug use: No    Allergies as of 06/17/2020  . (No Known Allergies)    Review of Systems:    All systems reviewed and negative except where noted in HPI.   Physical Exam:  There were no vitals taken for this visit. No LMP recorded. Patient is postmenopausal. Psych:  Alert and cooperative. Normal mood and affect. General:   Alert,  Well-developed, well-nourished, pleasant and cooperative in NAD Head:  Normocephalic and  atraumatic. Eyes:  Sclera clear, no icterus.   Conjunctiva pink. Ears:  Normal auditory acuity. Lungs:  Respirations even and unlabored.  Clear throughout to auscultation.   No wheezes, crackles, or rhonchi. No acute distress. Heart:  Regular rate and rhythm; no murmurs, clicks, rubs, or gallops. Abdomen:  Normal bowel sounds.  No bruits.  Soft, non-tender and non-distended without masses, hepatosplenomegaly or hernias noted.  No guarding or rebound tenderness.    Neurologic:  Alert and oriented x3;  grossly normal neurologically. Psych:  Alert and cooperative. Normal mood and affect.  Imaging Studies: No results found.  Assessment and Plan:   [name] is a 74 y.o. y/o female has been referred for bloating.   She is diabetic on Metformin 1.5 g a day.  Visit conducted via an interpreter.  Plan 1.  Low FODMAP diet patient information provided 2.  Activated charcoal tablets as needed as needed. 3.  If does not help options include a trial of holding Metformin as one of the side effects of Metformin can be bloating and it need not occur right after starting the medication can happen months or years after as well.  There is a reported side effect in about 35% of patients on Metformin.  Another option would be to treat for bacterial overgrowth syndrome with a course of Xifaxan. 4.  EGD for evaluation of upper GI symptoms and colonoscopy as she is due for 1.  I have discussed alternative options, risks & benefits,  which include, but are not limited to, bleeding, infection, perforation,respiratory complication & drug reaction.  The patient agrees with this plan & written consent will be obtained.     Follow up in 6 weeks   Dr Wyline Mood MD,MRCP(U.K)

## 2020-07-16 ENCOUNTER — Other Ambulatory Visit: Payer: Self-pay

## 2020-07-16 ENCOUNTER — Other Ambulatory Visit
Admission: RE | Admit: 2020-07-16 | Discharge: 2020-07-16 | Disposition: A | Discharge status: Home / Self Care | Payer: Medicare Other | Source: Ambulatory Visit | Attending: Gastroenterology | Admitting: Gastroenterology

## 2020-07-16 DIAGNOSIS — Z01812 Encounter for preprocedural laboratory examination: Secondary | ICD-10-CM | POA: Diagnosis present

## 2020-07-16 DIAGNOSIS — Z20822 Contact with and (suspected) exposure to covid-19: Secondary | ICD-10-CM | POA: Insufficient documentation

## 2020-07-17 LAB — SARS CORONAVIRUS 2 (TAT 6-24 HRS): SARS Coronavirus 2: NEGATIVE

## 2020-07-20 ENCOUNTER — Ambulatory Visit: Payer: Medicare Other | Admitting: Certified Registered"

## 2020-07-20 ENCOUNTER — Encounter
Admission: RE | Disposition: A | Discharge status: Home / Self Care | Payer: Self-pay | Source: Home / Self Care | Attending: Gastroenterology

## 2020-07-20 ENCOUNTER — Encounter: Payer: Self-pay | Admitting: Gastroenterology

## 2020-07-20 ENCOUNTER — Ambulatory Visit
Admission: RE | Admit: 2020-07-20 | Discharge: 2020-07-20 | Disposition: A | Discharge status: Home / Self Care | Payer: Medicare Other | Attending: Gastroenterology | Admitting: Gastroenterology

## 2020-07-20 DIAGNOSIS — I10 Essential (primary) hypertension: Secondary | ICD-10-CM | POA: Diagnosis not present

## 2020-07-20 DIAGNOSIS — E119 Type 2 diabetes mellitus without complications: Secondary | ICD-10-CM | POA: Diagnosis not present

## 2020-07-20 DIAGNOSIS — R1013 Epigastric pain: Secondary | ICD-10-CM | POA: Diagnosis not present

## 2020-07-20 DIAGNOSIS — Z7982 Long term (current) use of aspirin: Secondary | ICD-10-CM | POA: Diagnosis not present

## 2020-07-20 DIAGNOSIS — Z79899 Other long term (current) drug therapy: Secondary | ICD-10-CM | POA: Diagnosis not present

## 2020-07-20 DIAGNOSIS — K30 Functional dyspepsia: Secondary | ICD-10-CM | POA: Insufficient documentation

## 2020-07-20 DIAGNOSIS — Z1211 Encounter for screening for malignant neoplasm of colon: Secondary | ICD-10-CM | POA: Diagnosis not present

## 2020-07-20 DIAGNOSIS — Z7984 Long term (current) use of oral hypoglycemic drugs: Secondary | ICD-10-CM | POA: Insufficient documentation

## 2020-07-20 HISTORY — PX: COLONOSCOPY WITH PROPOFOL: SHX5780

## 2020-07-20 HISTORY — PX: ESOPHAGOGASTRODUODENOSCOPY (EGD) WITH PROPOFOL: SHX5813

## 2020-07-20 LAB — GLUCOSE, CAPILLARY: Glucose-Capillary: 181 mg/dL — ABNORMAL HIGH (ref 70–99)

## 2020-07-20 SURGERY — ESOPHAGOGASTRODUODENOSCOPY (EGD) WITH PROPOFOL
Anesthesia: General

## 2020-07-20 MED ORDER — PROPOFOL 500 MG/50ML IV EMUL
INTRAVENOUS | Status: DC | PRN
  Administered 2020-07-20: 165 ug/kg/min via INTRAVENOUS

## 2020-07-20 MED ORDER — LIDOCAINE HCL (CARDIAC) PF 100 MG/5ML IV SOSY
PREFILLED_SYRINGE | INTRAVENOUS | Status: DC | PRN
  Administered 2020-07-20: 100 mg via INTRAVENOUS

## 2020-07-20 MED ORDER — GLYCOPYRROLATE 0.2 MG/ML IJ SOLN
INTRAMUSCULAR | Status: DC | PRN
  Administered 2020-07-20: .2 mg via INTRAVENOUS

## 2020-07-20 MED ORDER — PROPOFOL 10 MG/ML IV BOLUS
INTRAVENOUS | Status: DC | PRN
  Administered 2020-07-20: 10 mg via INTRAVENOUS
  Administered 2020-07-20: 50 mg via INTRAVENOUS

## 2020-07-20 MED ORDER — SODIUM CHLORIDE 0.9 % IV SOLN: INTRAVENOUS | Status: DC

## 2020-07-20 NOTE — Op Note (Signed)
[facility] Gastroenterology Patient Name: Jocelyn Vazquez [facility] Procedure Date: 07/20/2020 9:00 AM MRN: [id number] Account #: [id number] Date of Birth: 1946/10/24 Admit Type: Outpatient Age: 73 Room: [facility] ENDO ROOM 4 Gender: Female Note Status: Finalized Procedure:             Upper GI endoscopy Indications:           Functional Dyspepsia Providers:             Wyline Mood MD, MD Referring MD:          Sherrie Mustache, MD (Referring MD) Medicines:             Monitored Anesthesia Care Complications:         No immediate complications. Procedure:             Pre-Anesthesia Assessment:                        - Prior to the procedure, a History and Physical was                         performed, and patient medications, allergies and                         sensitivities were reviewed. The patient's tolerance                         of previous anesthesia was reviewed.                        - The risks and benefits of the procedure and the                         sedation options and risks were discussed with the                         patient. All questions were answered and informed                         consent was obtained.                        - ASA Grade Assessment: II - A patient with mild                         systemic disease.                        After obtaining informed consent, the endoscope was                         passed under direct vision. Throughout the procedure,                         the patient's blood pressure, pulse, and oxygen                         saturations were monitored continuously. The Endoscope                         was introduced through the mouth, and  advanced to the                         third part of duodenum. The upper GI endoscopy was                         accomplished with ease. The patient tolerated the                         procedure well. Findings:      The esophagus was normal.      The stomach was  normal.      The examined duodenum was normal. Impression:            - Normal esophagus.                        - Normal stomach.                        - Normal examined duodenum.                        - No specimens collected. Recommendation:        - Perform a colonoscopy today. Procedure Code(s):     --- Professional ---                        [id number], Esophagogastroduodenoscopy, flexible,                         transoral; diagnostic, including collection of                         specimen(s) by brushing or washing, when performed                         (separate procedure) Diagnosis Code(s):     --- Professional ---                        K30, Functional dyspepsia CPT copyright 2019 American Medical Association. All rights reserved. The codes documented in this report are preliminary and upon coder review may  be revised to meet current compliance requirements. Wyline Mood, MD Wyline Mood MD, MD 07/20/2020 9:20:59 AM This report has been signed electronically. Number of Addenda: 0 Note Initiated On: 07/20/2020 9:00 AM Estimated Blood Loss:  Estimated blood loss: none.      [facility]

## 2020-07-20 NOTE — Transfer of Care (Signed)
Immediate Anesthesia Transfer of Care Note  Patient: Jocelyn Vazquez  Procedure(s) Performed: ESOPHAGOGASTRODUODENOSCOPY (EGD) WITH PROPOFOL (N/A ) COLONOSCOPY WITH PROPOFOL (N/A )  Patient Location: Endoscopy Unit  Anesthesia Type:General  Level of Consciousness: drowsy and responds to stimulation  Airway & Oxygen Therapy: Patient Spontanous Breathing and Patient connected to face mask oxygen  Post-op Assessment: Report given to RN and Post -op Vital signs reviewed and stable  Post vital signs: Reviewed and stable  Last Vitals:  Vitals Value Taken Time  BP 121/70 07/20/20 0936  Temp    Pulse 96 07/20/20 0937  Resp 15 07/20/20 0937  SpO2 100 % 07/20/20 0937  Vitals shown include unvalidated device data.  Last Pain:  Vitals:   07/20/20 0846  TempSrc: Temporal  PainSc:          Complications: No complications documented.

## 2020-07-20 NOTE — Anesthesia Preprocedure Evaluation (Signed)
Anesthesia Evaluation  Patient identified by MRN, date of birth, ID band Patient awake    Reviewed: Allergy & Precautions, H&P , NPO status , Patient's Chart, lab work & pertinent test results, reviewed documented beta blocker date and time   Airway Mallampati: II   Neck ROM: full    Dental  (+) Teeth Intact   Pulmonary neg pulmonary ROS,    Pulmonary exam normal        Cardiovascular Exercise Tolerance: Poor hypertension, On Medications negative cardio ROS Normal cardiovascular exam Rhythm:regular Rate:Normal     Neuro/Psych negative neurological ROS  negative psych ROS   GI/Hepatic negative GI ROS, Neg liver ROS,   Endo/Other  negative endocrine ROSdiabetes  Renal/GU Renal disease  negative genitourinary   Musculoskeletal   Abdominal   Peds  Hematology negative hematology ROS (+)   Anesthesia Other Findings Past Medical History: No date: Diabetes mellitus without complication (HCC) No date: Hypertension No date: Kidney stones Past Surgical History: 2002: ABDOMINAL HYSTERECTOMY 01/28/2016: BREAST BIOPSY; Left     Comment:  breast and lymph US guided biopsy 01/06/2016: COLONOSCOPY WITH PROPOFOL; N/A     Comment:  Procedure: COLONOSCOPY WITH PROPOFOL;  Surgeon:               Kieth Brightly, MD;  Location: [facility] ENDOSCOPY;                Service: Endoscopy;  Laterality: N/A; BMI    Body Mass Index: 29.03 kg/m     Reproductive/Obstetrics negative OB ROS                             Anesthesia Physical Anesthesia Plan  ASA: III  Anesthesia Plan: General   Post-op Pain Management:    Induction:   PONV Risk Score and Plan:   Airway Management Planned:   Additional Equipment:   Intra-op Plan:   Post-operative Plan:   Informed Consent: I have reviewed the patients History and Physical, chart, labs and discussed the procedure including the risks, benefits and  alternatives for the proposed anesthesia with the patient or authorized representative who has indicated his/her understanding and acceptance.     Dental Advisory Given  Plan Discussed with: CRNA  Anesthesia Plan Comments:         Anesthesia Quick Evaluation

## 2020-07-20 NOTE — H&P (Signed)
Wyline Mood, MD [address], [place], [place], [address] [address], [address], [place], [place], [address] Phone: [phone]  Fax: [phone]  Primary Care Physician:  Sherrie Mustache, MD   Pre-Procedure History & Physical: HPI:  Jocelyn Vazquez is a 74 y.o. female is here for an endoscopy and colonoscopy    Past Medical History:  Diagnosis Date  . Diabetes mellitus without complication (HCC)   . Hypertension   . Kidney stones     Past Surgical History:  Procedure Laterality Date  . ABDOMINAL HYSTERECTOMY  2002  . BREAST BIOPSY Left 01/28/2016   breast and lymph US guided biopsy  . COLONOSCOPY WITH PROPOFOL N/A 01/06/2016   Procedure: COLONOSCOPY WITH PROPOFOL;  Surgeon: Kieth Brightly, MD;  Location: [facility] ENDOSCOPY;  Service: Endoscopy;  Laterality: N/A;    Prior to Admission medications   Medication Sig Start Date End Date Taking? Authorizing Provider  aspirin 81 MG tablet Take 81 mg by mouth daily.   Yes [provider]  glipiZIDE (GLUCOTROL XL) 5 MG 24 hr tablet TK 1 T PO BID 09/30/15  Yes [provider]  KOMBIGLYZE XR 03-999 MG TB24 TK 1 T PO QD 12/03/15  Yes [provider]  losartan (COZAAR) 50 MG tablet TK 1 T PO QD 09/29/15  Yes [provider]  lovastatin (MEVACOR) 20 MG tablet TK 1 T PO QD 09/29/15  Yes [provider]  metFORMIN (GLUCOPHAGE) 1000 MG tablet TK 1 T PO QD 09/29/15  Yes [provider]  Na Sulfate-K Sulfate-Mg Sulf 17.5-3.13-1.6 GM/177ML SOLN At 5 PM the day before procedure take 1 bottle and 5 hours before procedure take 1 bottle. 06/17/20  Yes Wyline Mood, MD  polyethylene glycol powder [facility]) powder 255 grams one bottle for colonoscopy prep 12/10/15  Yes Kieth Brightly, MD    Allergies as of 06/17/2020  . (No Known Allergies)    Family History  Problem Relation Age of Onset  . Colon cancer Mother 25  . Breast cancer Neg Hx     Social  History   Socioeconomic History  . Marital status: Single    Spouse name: Not on file  . Number of children: Not on file  . Years of education: Not on file  . Highest education level: Not on file  Occupational History  . Not on file  Tobacco Use  . Smoking status: Never Smoker  . Smokeless tobacco: Never Used  Substance and Sexual Activity  . Alcohol use: No    Alcohol/week: 0.0 standard drinks  . Drug use: No  . Sexual activity: Not on file  Other Topics Concern  . Not on file  Social History Narrative  . Not on file   Social Determinants of Health   Financial Resource Strain:   . Difficulty of Paying Living Expenses: Not on file  Food Insecurity:   . Worried About Programme researcher, broadcasting/film/video in the Last Year: Not on file  . Ran Out of Food in the Last Year: Not on file  Transportation Needs:   . Lack of Transportation (Medical): Not on file  . Lack of Transportation (Non-Medical): Not on file  Physical Activity:   . Days of Exercise per Week: Not on file  . Minutes of Exercise per Session: Not on file  Stress:   . Feeling of Stress : Not on file  Social Connections:   . Frequency of Communication with Friends and Family: Not on file  .  Frequency of Social Gatherings with Friends and Family: Not on file  . Attends Religious Services: Not on file  . Active Member of Clubs or Organizations: Not on file  . Attends Banker Meetings: Not on file  . Marital Status: Not on file  Intimate Partner Violence:   . Fear of Current or Ex-Partner: Not on file  . Emotionally Abused: Not on file  . Physically Abused: Not on file  . Sexually Abused: Not on file    Review of Systems: See HPI, otherwise negative ROS  Physical Exam: BP 136/87   Pulse 92   Temp (!) 96.1 F (35.6 C) (Temporal)   Resp 20   Ht 4' 11.06" (1.5 m)   Wt 65.3 kg   SpO2 98%   BMI 29.03 kg/m  General:   Alert,  pleasant and cooperative in NAD Head:  Normocephalic and atraumatic. Neck:   Supple; no masses or thyromegaly. Lungs:  Clear throughout to auscultation, normal respiratory effort.    Heart:  +S1, +S2, Regular rate and rhythm, No edema. Abdomen:  Soft, nontender and nondistended. Normal bowel sounds, without guarding, and without rebound.   Neurologic:  Alert and  oriented x4;  grossly normal neurologically.  Impression/Plan: Jocelyn Vazquez is here for an endoscopy and colonoscopy  to be performed for  evaluation of CRCS and bloating    Risks, benefits, limitations, and alternatives regarding endoscopy have been reviewed with the patient.  Questions have been answered.  All parties agreeable.   Wyline Mood, MD  07/20/2020, 8:53 AM

## 2020-07-20 NOTE — Op Note (Signed)
[facility] Gastroenterology Patient Name: [facility] Procedure Date: 07/20/2020 8:57 AM MRN: [id number] Account #: [id number] Date of Birth: May 23, 1946 Admit Type: Outpatient Age: 74 Room: [facility] ENDO ROOM 4 Gender: Female Note Status: Finalized Procedure:             Colonoscopy Indications:           Screening for colorectal malignant neoplasm Providers:             Wyline Mood MD, MD Referring MD:          Sherrie Mustache, MD (Referring MD) Medicines:             Monitored Anesthesia Care Complications:         No immediate complications. Procedure:             Pre-Anesthesia Assessment:                        - Prior to the procedure, a History and Physical was                         performed, and patient medications, allergies and                         sensitivities were reviewed. The patient's tolerance                         of previous anesthesia was reviewed.                        - The risks and benefits of the procedure and the                         sedation options and risks were discussed with the                         patient. All questions were answered and informed                         consent was obtained.                        - ASA Grade Assessment: II - A patient with mild                         systemic disease.                        After obtaining informed consent, the colonoscope was                         passed under direct vision. Throughout the procedure,                         the patient's blood pressure, pulse, and oxygen                         saturations were monitored continuously. The                         Colonoscope was introduced through the anus  and                         advanced to the the cecum, identified by the                         appendiceal orifice. The colonoscopy was performed                         with ease. The patient tolerated the procedure well.                         The  quality of the bowel preparation was excellent. Findings:      The perianal and digital rectal examinations were normal.      The exam was otherwise without abnormality on direct and retroflexion       views. Impression:            - The examination was otherwise normal on direct and                         retroflexion views.                        - No specimens collected. Recommendation:        - Discharge patient to home (ambulatory).                        - Resume previous diet.                        - Continue present medications.                        - Return to my office as previously scheduled. Procedure Code(s):     --- Professional ---                        [phone], Colonoscopy, flexible; diagnostic, including                         collection of specimen(s) by brushing or washing, when                         performed (separate procedure) Diagnosis Code(s):     --- Professional ---                        Z12.11, Encounter for screening for malignant neoplasm                         of colon CPT copyright 2019 American Medical Association. All rights reserved. The codes documented in this report are preliminary and upon coder review may  be revised to meet current compliance requirements. Wyline Mood, MD Wyline Mood MD, MD 07/20/2020 9:34:39 AM This report has been signed electronically. Number of Addenda: 0 Note Initiated On: 07/20/2020 8:57 AM Scope Withdrawal Time: 0 hours 6 minutes 58 seconds  Total Procedure Duration: 0 hours 10 minutes 9 seconds  Estimated Blood Loss:  Estimated blood loss: none.      [facility]

## 2020-07-20 NOTE — Anesthesia Procedure Notes (Signed)
Procedure Name: General with mask airway Performed by: Fletcher-Harrison, Florencia Zaccaro, CRNA Pre-anesthesia Checklist: Patient identified, Emergency Drugs available, Suction available and Patient being monitored Patient Re-evaluated:Patient Re-evaluated prior to induction Oxygen Delivery Method: Simple face mask Induction Type: IV induction Placement Confirmation: positive ETCO2 and CO2 detector Dental Injury: Teeth and Oropharynx as per pre-operative assessment        

## 2020-07-20 NOTE — OR Nursing (Signed)
AMN Video Remote Interpreting used for admission.

## 2020-07-22 ENCOUNTER — Encounter: Payer: Self-pay | Admitting: Gastroenterology

## 2020-07-23 NOTE — Anesthesia Postprocedure Evaluation (Signed)
Anesthesia Post Note  Patient: Jocelyn Vazquez  Procedure(s) Performed: ESOPHAGOGASTRODUODENOSCOPY (EGD) WITH PROPOFOL (N/A ) COLONOSCOPY WITH PROPOFOL (N/A )  Patient location during evaluation: PACU Anesthesia Type: General Level of consciousness: awake and alert Pain management: pain level controlled Vital Signs Assessment: post-procedure vital signs reviewed and stable Respiratory status: spontaneous breathing, nonlabored ventilation, respiratory function stable and patient connected to nasal cannula oxygen Cardiovascular status: blood pressure returned to baseline and stable Postop Assessment: no apparent nausea or vomiting Anesthetic complications: no   No complications documented.   Last Vitals:  Vitals:   07/20/20 0937 07/20/20 0947  BP: 121/70 117/73  Pulse: 97 93  Resp: 14 (!) 28  Temp:    SpO2: 100% 100%    Last Pain:  Vitals:   07/20/20 0947  TempSrc:   PainSc: 0-No pain                 Yevette Edwards

## 2020-09-02 ENCOUNTER — Ambulatory Visit: Payer: Medicaid Other | Admitting: Gastroenterology

## 2021-02-16 ENCOUNTER — Other Ambulatory Visit: Payer: Self-pay

## 2021-02-16 NOTE — Patient Outreach (Signed)
Medicaid Managed Care Social Work Note  02/16/2021 Name:  Jocelyn Vazquez MRN:  [id number] DOB:  Apr 13, 1946  Jocelyn Vazquez is an 75 y.o. year old female who is a primary patient of Sherrie Mustache, MD.  The [facility] Managed Care Coordination team was consulted for assistance with:  Interpersonal Safety  Jocelyn Vazquez was given information about Medicaid Managed CareCoordination services today. Jocelyn Vazquez agreed to services and verbal consent obtained.  Engaged with patient  for by telephone forinitial visit in response to referral for case management and/or care coordination services.   Assessments/Interventions:  Review of past medical history, allergies, medications, health status, including review of consultants reports, laboratory and other test data, was performed as part of comprehensive evaluation and provision of chronic care management services.  SDOH: (Social Determinant of Health) assessments and interventions performed:   BSW contacted patient to follow up with Interpersonal safety. Interpreter used. Patient stated she lives with her son and his wife takes care of her. No resources are needed at this time.  Advanced Directives Status:  Not addressed in this encounter.  Care Plan                 No Known Allergies  Medications Reviewed Today    Reviewed by Althea Charon, RN (Registered Nurse) on 07/20/20 at 0848  Med List Status: <None>  Medication Order Taking? Sig Documenting Provider Last Dose Status Informant  aspirin 81 MG tablet [id number] Yes Take 81 mg by mouth daily. [provider] Past Week Unknown time Active   glipiZIDE (GLUCOTROL XL) 5 MG 24 hr tablet [id number] Yes TK 1 T PO BID [provider] 07/19/2020 Unknown time Active            Med Note [facility], MARSHA M   Thu Dec 10, 2015  8:55 AM) Received from: External Pharmacy  KOMBIGLYZE XR 03-999 MG TB24 [id number] Yes TK 1 T PO QD [provider] Past  Week Unknown time Active            Med Note Georgetta Haber, MARSHA M   Thu Dec 10, 2015  8:55 AM) Received from: External Pharmacy  losartan (COZAAR) 50 MG tablet [id number] Yes TK 1 T PO QD [provider] 07/19/2020 Unknown time Active            Med Note [facility], MARSHA M   Thu Dec 10, 2015  8:55 AM) Received from: External Pharmacy  lovastatin (MEVACOR) 20 MG tablet [id number] Yes TK 1 T PO QD [provider] Past Week Unknown time Active            Med Note Georgetta Haber, MARSHA M   Thu Dec 10, 2015  8:55 AM) Received from: External Pharmacy  metFORMIN (GLUCOPHAGE) 1000 MG tablet [id number] Yes TK 1 T PO QD [provider] Past Week Unknown time Active            Med Note Georgetta Haber, MARSHA M   Thu Dec 10, 2015  8:55 AM) Received from: External Pharmacy  Na Sulfate-K Sulfate-Mg Sulf 17.5-3.13-1.6 GM/177ML SOLN [id number] Yes At 5 PM the day before procedure take 1 bottle and 5 hours before procedure take 1 bottle. Wyline Mood, MD Past Week Unknown time Active   polyethylene glycol powder [facility]) powder [id number] Yes 255 grams one bottle for colonoscopy prep Kieth Brightly, MD 07/19/2020 Unknown time Active           There are no problems to display for this patient.  Conditions to be addressed/monitored per PCP order:  Interpersonal safety  There are no care plans that you recently modified to display for this patient.   Follow up:  Patient requests no follow-up at this time.  Plan: The patient has been provided with contact information for the Managed Medicaid care management team and has been advised to call with any health related questions or concerns.    Gus Puma, BSW, [facility]  [place]  High Risk Managed Medicaid Team

## 2021-02-16 NOTE — Patient Instructions (Signed)
Visit Information  Ms. Jocelyn Vazquez was given information about Medicaid Managed Care team care coordination services as a part of their [facility] Medicaid benefit. Jocelyn Vazquez did not consent to engagement with the [facility] Managed Care team.   For questions related to your [facility] health plan, please call: [phone]  If you would like to schedule transportation through your [facility] plan, please call the following number at least 2 days in advance of your appointment: [phone]   Call the [facility] Crisis Line at [phone], at any time, 24 hours a day, 7 days a week. If you are in danger or need immediate medical attention call 911.  Ms. Jocelyn Vazquez - following are the goals we discussed in your visit today:  Goals Addressed   None     The patient has been provided with contact information for the Managed Medicaid care management team and has been advised to call with any health related questions or concerns.   Gus Puma, BSW, [facility]   [place]  High Risk Managed Medicaid Team    Following is a copy of your plan of care:  There are no care plans to display for this patient.

## 2021-03-28 DIAGNOSIS — Z419 Encounter for procedure for purposes other than remedying health state, unspecified: Secondary | ICD-10-CM | POA: Diagnosis not present

## 2021-04-28 DIAGNOSIS — Z419 Encounter for procedure for purposes other than remedying health state, unspecified: Secondary | ICD-10-CM | POA: Diagnosis not present

## 2021-07-14 ENCOUNTER — Ambulatory Visit: Admit: 2021-07-14 | Payer: Medicaid Other | Admitting: Ophthalmology

## 2021-07-14 SURGERY — PHACOEMULSIFICATION, CATARACT, WITH IOL INSERTION
Anesthesia: Topical | Laterality: Right

## 2021-07-28 ENCOUNTER — Ambulatory Visit: Admit: 2021-07-28 | Payer: Medicaid Other | Admitting: Ophthalmology

## 2021-07-28 SURGERY — PHACOEMULSIFICATION, CATARACT, WITH IOL INSERTION
Anesthesia: Topical | Laterality: Left

## 2021-10-14 ENCOUNTER — Encounter: Payer: Self-pay | Admitting: Ophthalmology

## 2021-10-27 ENCOUNTER — Ambulatory Visit: Payer: Medicare Other | Admitting: Anesthesiology

## 2021-10-27 ENCOUNTER — Other Ambulatory Visit: Payer: Self-pay

## 2021-10-27 ENCOUNTER — Encounter: Payer: Self-pay | Admitting: Ophthalmology

## 2021-10-27 ENCOUNTER — Encounter
Admission: RE | Disposition: A | Discharge status: Home / Self Care | Payer: Self-pay | Source: Ambulatory Visit | Attending: Ophthalmology

## 2021-10-27 ENCOUNTER — Ambulatory Visit
Admission: RE | Admit: 2021-10-27 | Discharge: 2021-10-27 | Disposition: A | Discharge status: Home / Self Care | Payer: Medicare Other | Source: Ambulatory Visit | Attending: Ophthalmology | Admitting: Ophthalmology

## 2021-10-27 DIAGNOSIS — E1136 Type 2 diabetes mellitus with diabetic cataract: Secondary | ICD-10-CM | POA: Insufficient documentation

## 2021-10-27 DIAGNOSIS — H2511 Age-related nuclear cataract, right eye: Secondary | ICD-10-CM | POA: Insufficient documentation

## 2021-10-27 DIAGNOSIS — I10 Essential (primary) hypertension: Secondary | ICD-10-CM | POA: Insufficient documentation

## 2021-10-27 HISTORY — PX: CATARACT EXTRACTION W/PHACO: SHX586

## 2021-10-27 LAB — GLUCOSE, CAPILLARY
Glucose-Capillary: 166 mg/dL — ABNORMAL HIGH (ref 70–99)
Glucose-Capillary: 163 mg/dL — ABNORMAL HIGH (ref 70–99)

## 2021-10-27 SURGERY — PHACOEMULSIFICATION, CATARACT, WITH IOL INSERTION
Anesthesia: Monitor Anesthesia Care | Site: Eye | Laterality: Right

## 2021-10-27 MED ORDER — BRIMONIDINE TARTRATE-TIMOLOL 0.2-0.5 % OP SOLN
OPHTHALMIC | Status: DC | PRN
  Administered 2021-10-27: 1 [drp] via OPHTHALMIC

## 2021-10-27 MED ORDER — MIDAZOLAM HCL 2 MG/2ML IJ SOLN
INTRAMUSCULAR | Status: DC | PRN
  Administered 2021-10-27: 1 mg via INTRAVENOUS

## 2021-10-27 MED ORDER — ARMC OPHTHALMIC DILATING DROPS
1.0000 "application " | OPHTHALMIC | Status: DC | PRN
  Administered 2021-10-27 (×3): 1 via OPHTHALMIC

## 2021-10-27 MED ORDER — ACETAMINOPHEN 160 MG/5ML PO SOLN: 975.0000 mg | Freq: Once | ORAL | Status: DC | PRN

## 2021-10-27 MED ORDER — ONDANSETRON HCL 4 MG/2ML IJ SOLN: 4.0000 mg | Freq: Once | INTRAMUSCULAR | Status: DC | PRN

## 2021-10-27 MED ORDER — TETRACAINE HCL 0.5 % OP SOLN
1.0000 [drp] | OPHTHALMIC | Status: AC | PRN
  Administered 2021-10-27 (×3): 1 [drp] via OPHTHALMIC

## 2021-10-27 MED ORDER — SIGHTPATH DOSE#1 BSS IO SOLN
INTRAOCULAR | Status: DC | PRN
  Administered 2021-10-27: 15 mL

## 2021-10-27 MED ORDER — SIGHTPATH DOSE#1 NA HYALUR & NA CHOND-NA HYALUR IO KIT
PACK | INTRAOCULAR | Status: DC | PRN
  Administered 2021-10-27: 1 via OPHTHALMIC

## 2021-10-27 MED ORDER — SIGHTPATH DOSE#1 BSS IO SOLN
INTRAOCULAR | Status: DC | PRN
  Administered 2021-10-27: 1 mL via INTRAMUSCULAR

## 2021-10-27 MED ORDER — LACTATED RINGERS IV SOLN: INTRAVENOUS | Status: DC

## 2021-10-27 MED ORDER — CEFUROXIME OPHTHALMIC INJECTION 1 MG/0.1 ML
INJECTION | OPHTHALMIC | Status: DC | PRN
  Administered 2021-10-27: 0.1 mL via INTRACAMERAL

## 2021-10-27 MED ORDER — ACETAMINOPHEN 500 MG PO TABS: 1000.0000 mg | ORAL_TABLET | Freq: Once | ORAL | Status: DC | PRN

## 2021-10-27 MED ORDER — FENTANYL CITRATE (PF) 100 MCG/2ML IJ SOLN
INTRAMUSCULAR | Status: DC | PRN
  Administered 2021-10-27 (×2): 25 ug via INTRAVENOUS

## 2021-10-27 MED ORDER — SIGHTPATH DOSE#1 BSS IO SOLN
INTRAOCULAR | Status: DC | PRN
  Administered 2021-10-27: 67 mL via OPHTHALMIC

## 2021-10-27 SURGICAL SUPPLY — 20 items
ANGLE REVERSE CUT SHRT 25GA (CUTTER) ×2
CANNULA ANT/CHMB 27GA (MISCELLANEOUS) ×2 IMPLANT
CYSTOTOME ANGL RVRS SHRT 25GA (CUTTER) ×1 IMPLANT
GLOVE SRG 8 PF TXTR STRL LF DI (GLOVE) ×1 IMPLANT
GLOVE SURG ENC TEXT LTX SZ7.5 (GLOVE) ×2 IMPLANT
GLOVE SURG UNDER POLY LF SZ8 (GLOVE) ×2
GOWN STRL REUS W/ TWL LRG LVL3 (GOWN DISPOSABLE) ×2 IMPLANT
GOWN STRL REUS W/TWL LRG LVL3 (GOWN DISPOSABLE) ×4
LENS IOL TECNIS EYHANCE 23.5 (Intraocular Lens) ×2 IMPLANT
MARKER SKIN DUAL TIP RULER LAB (MISCELLANEOUS) ×2 IMPLANT
NEEDLE CAPSULORHEX 25GA (NEEDLE) IMPLANT
NEEDLE FILTER BLUNT 18X 1/2SAF (NEEDLE) ×2
NEEDLE FILTER BLUNT 18X1 1/2 (NEEDLE) ×2 IMPLANT
PACK EYE AFTER SURG (MISCELLANEOUS) ×2 IMPLANT
PACK VIT ANT 23G (MISCELLANEOUS) IMPLANT
SYR 3ML LL SCALE MARK (SYRINGE) ×4 IMPLANT
SYR TB 1ML LUER SLIP (SYRINGE) ×2 IMPLANT
TIP ITREPID SGL USE BENT I/A (SUCTIONS) ×2 IMPLANT
WATER STERILE IRR 250ML POUR (IV SOLUTION) ×2 IMPLANT
WIPE NON LINTING 3.25X3.25 (MISCELLANEOUS) ×2 IMPLANT

## 2021-10-27 NOTE — Op Note (Signed)
LOCATION:  [facility]   PREOPERATIVE DIAGNOSIS:    Nuclear sclerotic cataract right eye. H25.11   POSTOPERATIVE DIAGNOSIS:  Nuclear sclerotic cataract right eye.     PROCEDURE:  Phacoemusification with posterior chamber intraocular lens placement of the right eye   ULTRASOUND TIME: Procedure(s) with comments: CATARACT EXTRACTION PHACO AND INTRAOCULAR LENS PLACEMENT (IOC) RIGHT DIABETIC 15.34 01:23.3 (Right) - Diabetic  LENS:   Implant Name Type Inv. Item Serial No. Manufacturer Lot No. LRB No. Used Action  LENS IOL TECNIS EYHANCE 23.5 - [id number] Intraocular Lens LENS IOL TECNIS EYHANCE 23.5 [id number] JOHNSON   Right 1 Implanted         SURGEON:  Deirdre Evener, MD   ANESTHESIA:  Topical with tetracaine drops and 2% Xylocaine jelly, augmented with 1% preservative-free intracameral lidocaine.    COMPLICATIONS:  None.   DESCRIPTION OF PROCEDURE:  The patient was identified in the holding room and transported to the operating room and placed in the supine position under the operating microscope.  The right eye was identified as the operative eye and it was prepped and draped in the usual sterile ophthalmic fashion.   A 1 millimeter clear-corneal paracentesis was made at the 12:00 position.  0.5 ml of preservative-free 1% lidocaine was injected into the anterior chamber. The anterior chamber was filled with Viscoat viscoelastic.  A 2.4 millimeter keratome was used to make a near-clear corneal incision at the 9:00 position.  A curvilinear capsulorrhexis was made with a cystotome and capsulorrhexis forceps.  Balanced salt solution was used to hydrodissect and hydrodelineate the nucleus.   Phacoemulsification was then used in stop and chop fashion to remove the lens nucleus and epinucleus.  The remaining cortex was then removed using the irrigation and aspiration handpiece. Provisc was then placed into the capsular bag to distend it for lens placement.  A lens was then  injected into the capsular bag.  The remaining viscoelastic was aspirated.   Wounds were hydrated with balanced salt solution.  The anterior chamber was inflated to a physiologic pressure with balanced salt solution.  No wound leaks were noted. Cefuroxime 0.1 ml of a 10mg /ml solution was injected into the anterior chamber for a dose of 1 mg of intracameral antibiotic at the completion of the case.   Timolol and Brimonidine drops were applied to the eye.  The patient was taken to the recovery room in stable condition without complications of anesthesia or surgery.   Ples Trudel 10/27/2021, 9:13 AM

## 2021-10-27 NOTE — Anesthesia Preprocedure Evaluation (Addendum)
Anesthesia Evaluation  Patient identified by MRN, date of birth, ID band Patient awake    Reviewed: Allergy & Precautions, H&P , NPO status , Patient's Chart, lab work & pertinent test results, reviewed documented beta blocker date and time   Airway Mallampati: II  TM Distance: >3 FB Neck ROM: full    Dental no notable dental hx.    Pulmonary neg pulmonary ROS,    Pulmonary exam normal breath sounds clear to auscultation       Cardiovascular Exercise Tolerance: Good hypertension, negative cardio ROS   Rhythm:regular Rate:Normal     Neuro/Psych negative neurological ROS  negative psych ROS   GI/Hepatic negative GI ROS, Neg liver ROS,   Endo/Other  negative endocrine ROSdiabetes  Renal/GU negative Renal ROSnephrolithiasis  negative genitourinary   Musculoskeletal   Abdominal   Peds  Hematology negative hematology ROS (+)   Anesthesia Other Findings   Reproductive/Obstetrics negative OB ROS                             Anesthesia Physical Anesthesia Plan  ASA: 3  Anesthesia Plan: MAC   Post-op Pain Management:    Induction:   PONV Risk Score and Plan: 2 and TIVA and Treatment may vary due to age or medical condition  Airway Management Planned:   Additional Equipment:   Intra-op Plan:   Post-operative Plan:   Informed Consent: I have reviewed the patients History and Physical, chart, labs and discussed the procedure including the risks, benefits and alternatives for the proposed anesthesia with the patient or authorized representative who has indicated his/her understanding and acceptance.     Dental Advisory Given  Plan Discussed with: CRNA  Anesthesia Plan Comments:         Anesthesia Quick Evaluation

## 2021-10-27 NOTE — Anesthesia Postprocedure Evaluation (Signed)
Anesthesia Post Note  Patient: Jocelyn Vazquez  Procedure(s) Performed: CATARACT EXTRACTION PHACO AND INTRAOCULAR LENS PLACEMENT (IOC) RIGHT DIABETIC 15.34 01:23.3 (Right: Eye)     Patient location during evaluation: PACU Anesthesia Type: MAC Level of consciousness: awake and alert Pain management: pain level controlled Vital Signs Assessment: post-procedure vital signs reviewed and stable Respiratory status: spontaneous breathing, nonlabored ventilation and respiratory function stable Cardiovascular status: stable and blood pressure returned to baseline Postop Assessment: no apparent nausea or vomiting Anesthetic complications: no   No notable events documented.  April Manson

## 2021-10-27 NOTE — Transfer of Care (Signed)
Immediate Anesthesia Transfer of Care Note  Patient: Jocelyn Vazquez  Procedure(s) Performed: CATARACT EXTRACTION PHACO AND INTRAOCULAR LENS PLACEMENT (IOC) RIGHT DIABETIC 15.34 01:23.3 (Right: Eye)  Patient Location: PACU  Anesthesia Type: MAC  Level of Consciousness: awake, alert  and patient cooperative  Airway and Oxygen Therapy: Patient Spontanous Breathing and Patient connected to supplemental oxygen  Post-op Assessment: Post-op Vital signs reviewed, Patient's Cardiovascular Status Stable, Respiratory Function Stable, Patent Airway and No signs of Nausea or vomiting  Post-op Vital Signs: Reviewed and stable  Complications: No notable events documented.

## 2021-10-27 NOTE — Anesthesia Procedure Notes (Signed)
Procedure Name: MAC Date/Time: 10/27/2021 8:50 AM Performed by: Dionne Bucy, CRNA Pre-anesthesia Checklist: Patient identified, Emergency Drugs available, Suction available, Patient being monitored and Timeout performed Patient Re-evaluated:Patient Re-evaluated prior to induction Oxygen Delivery Method: Nasal cannula Placement Confirmation: positive ETCO2

## 2021-10-27 NOTE — H&P (Signed)
[facility]   Primary Care Physician:  Sherrie Mustache, MD Ophthalmologist: Dr. Lockie Mola  Pre-Procedure History & Physical: HPI:  Jocelyn Vazquez is a 75 y.o. female here for ophthalmic surgery.   Past Medical History:  Diagnosis Date   Diabetes mellitus without complication (HCC)    Hypertension    Kidney stones     Past Surgical History:  Procedure Laterality Date   ABDOMINAL HYSTERECTOMY  2002   BREAST BIOPSY Left 01/28/2016   breast and lymph US guided biopsy   COLONOSCOPY WITH PROPOFOL N/A 01/06/2016   Procedure: COLONOSCOPY WITH PROPOFOL;  Surgeon: Kieth Brightly, MD;  Location: [facility] ENDOSCOPY;  Service: Endoscopy;  Laterality: N/A;   COLONOSCOPY WITH PROPOFOL N/A 07/20/2020   Procedure: COLONOSCOPY WITH PROPOFOL;  Surgeon: Wyline Mood, MD;  Location: [facility] ENDOSCOPY;  Service: Gastroenterology;  Laterality: N/A;   ESOPHAGOGASTRODUODENOSCOPY (EGD) WITH PROPOFOL N/A 07/20/2020   Procedure: ESOPHAGOGASTRODUODENOSCOPY (EGD) WITH PROPOFOL;  Surgeon: Wyline Mood, MD;  Location: [facility] ENDOSCOPY;  Service: Gastroenterology;  Laterality: N/A;    Prior to Admission medications   Medication Sig Start Date End Date Taking? Authorizing Provider  aspirin 81 MG tablet Take 81 mg by mouth daily.   Yes [provider]  CALCIUM PO Take by mouth daily.   Yes [provider]  glipiZIDE (GLUCOTROL XL) 5 MG 24 hr tablet 10 mg 2 (two) times daily. 09/30/15  Yes [provider]  losartan-hydrochlorothiazide (HYZAAR) 100-25 MG tablet Take 1 tablet by mouth daily.   Yes [provider]  lovastatin (MEVACOR) 20 MG tablet TK 1 T PO QD 09/29/15  Yes [provider]  metFORMIN (GLUCOPHAGE) 1000 MG tablet 500 mg daily with supper. 09/29/15  Yes [provider]  sitaGLIPtin-metformin (JANUMET) 50-1000 MG tablet Take 1 tablet by mouth daily.   Yes [provider]    Allergies as of 09/29/2021   (No Known Allergies)     Family History  Problem Relation Age of Onset   Colon cancer Mother 75   Breast cancer Neg Hx     Social History   Socioeconomic History   Marital status: Single    Spouse name: Not on file   Number of children: Not on file   Years of education: Not on file   Highest education level: Not on file  Occupational History   Not on file  Tobacco Use   Smoking status: Never   Smokeless tobacco: Never  Vaping Use   Vaping Use: Never used  Substance and Sexual Activity   Alcohol use: No    Alcohol/week: 0.0 standard drinks   Drug use: No   Sexual activity: Not on file  Other Topics Concern   Not on file  Social History Narrative   Not on file   Social Determinants of Health   Financial Resource Strain: Not on file  Food Insecurity: Not on file  Transportation Needs: Not on file  Physical Activity: Not on file  Stress: Not on file  Social Connections: Not on file  Intimate Partner Violence: Not on file    Review of Systems: See HPI, otherwise negative ROS  Physical Exam: BP (!) 152/79   Pulse 82   Temp 97.7 F (36.5 C) (Temporal)   Resp 16   Ht 4\' 11"  (1.499 m)   Wt 66.2 kg   SpO2 97%   BMI 29.49 kg/m  General:   Alert,  pleasant and cooperative in NAD Head:  Normocephalic and atraumatic. Lungs:  Clear to  auscultation.    Heart:  Regular rate and rhythm.   Impression/Plan: Jocelyn Vazquez is here for ophthalmic surgery.  Risks, benefits, limitations, and alternatives regarding ophthalmic surgery have been reviewed with the patient.  Questions have been answered.  All parties agreeable.   Lockie Mola, MD  10/27/2021, 8:40 AM

## 2021-11-08 ENCOUNTER — Encounter: Payer: Self-pay | Admitting: Ophthalmology

## 2021-11-08 NOTE — Discharge Instructions (Signed)

## 2021-11-10 ENCOUNTER — Ambulatory Visit: Payer: Medicare Other | Admitting: Anesthesiology

## 2021-11-10 ENCOUNTER — Encounter
Admission: RE | Disposition: A | Discharge status: Home / Self Care | Payer: Self-pay | Source: Ambulatory Visit | Attending: Ophthalmology

## 2021-11-10 ENCOUNTER — Other Ambulatory Visit: Payer: Self-pay

## 2021-11-10 ENCOUNTER — Ambulatory Visit
Admission: RE | Admit: 2021-11-10 | Discharge: 2021-11-10 | Disposition: A | Discharge status: Home / Self Care | Payer: Medicare Other | Source: Ambulatory Visit | Attending: Ophthalmology | Admitting: Ophthalmology

## 2021-11-10 ENCOUNTER — Encounter: Payer: Self-pay | Admitting: Ophthalmology

## 2021-11-10 DIAGNOSIS — E1136 Type 2 diabetes mellitus with diabetic cataract: Secondary | ICD-10-CM | POA: Insufficient documentation

## 2021-11-10 DIAGNOSIS — H2512 Age-related nuclear cataract, left eye: Secondary | ICD-10-CM | POA: Diagnosis not present

## 2021-11-10 HISTORY — PX: CATARACT EXTRACTION W/PHACO: SHX586

## 2021-11-10 LAB — GLUCOSE, CAPILLARY
Glucose-Capillary: 150 mg/dL — ABNORMAL HIGH (ref 70–99)
Glucose-Capillary: 150 mg/dL — ABNORMAL HIGH (ref 70–99)

## 2021-11-10 SURGERY — PHACOEMULSIFICATION, CATARACT, WITH IOL INSERTION
Anesthesia: Monitor Anesthesia Care | Site: Eye | Laterality: Left

## 2021-11-10 MED ORDER — CYCLOPENTOLATE HCL 2 % OP SOLN
1.0000 [drp] | OPHTHALMIC | Status: AC | PRN
  Administered 2021-11-10 (×3): 1 [drp] via OPHTHALMIC

## 2021-11-10 MED ORDER — NEOMYCIN-POLYMYXIN-DEXAMETH 3.5-10000-0.1 OP OINT
TOPICAL_OINTMENT | OPHTHALMIC | Status: DC | PRN
  Administered 2021-11-10: 1 via OPHTHALMIC

## 2021-11-10 MED ORDER — SIGHTPATH DOSE#1 NA HYALUR & NA CHOND-NA HYALUR IO KIT
PACK | INTRAOCULAR | Status: DC | PRN
  Administered 2021-11-10: 1 via OPHTHALMIC

## 2021-11-10 MED ORDER — SIGHTPATH DOSE#1 BSS IO SOLN
INTRAOCULAR | Status: DC | PRN
  Administered 2021-11-10: 09:00:00 99 mL via OPHTHALMIC

## 2021-11-10 MED ORDER — LACTATED RINGERS IV SOLN: INTRAVENOUS | Status: DC

## 2021-11-10 MED ORDER — MIDAZOLAM HCL 2 MG/2ML IJ SOLN
INTRAMUSCULAR | Status: DC | PRN
  Administered 2021-11-10: 1 mg via INTRAVENOUS

## 2021-11-10 MED ORDER — PHENYLEPHRINE HCL 10 % OP SOLN
1.0000 [drp] | OPHTHALMIC | Status: AC | PRN
  Administered 2021-11-10 (×3): 1 [drp] via OPHTHALMIC

## 2021-11-10 MED ORDER — TETRACAINE HCL 0.5 % OP SOLN
1.0000 [drp] | OPHTHALMIC | Status: AC | PRN
  Administered 2021-11-10 (×3): 1 [drp] via OPHTHALMIC

## 2021-11-10 MED ORDER — SIGHTPATH DOSE#1 BSS IO SOLN
INTRAOCULAR | Status: DC | PRN
  Administered 2021-11-10: 1 mL via INTRAMUSCULAR

## 2021-11-10 MED ORDER — FENTANYL CITRATE (PF) 100 MCG/2ML IJ SOLN
INTRAMUSCULAR | Status: DC | PRN
  Administered 2021-11-10: 50 ug via INTRAVENOUS

## 2021-11-10 MED ORDER — BRIMONIDINE TARTRATE-TIMOLOL 0.2-0.5 % OP SOLN
OPHTHALMIC | Status: DC | PRN
  Administered 2021-11-10: 1 [drp] via OPHTHALMIC

## 2021-11-10 MED ORDER — SIGHTPATH DOSE#1 BSS IO SOLN
INTRAOCULAR | Status: DC | PRN
  Administered 2021-11-10: 15 mL

## 2021-11-10 MED ORDER — CEFUROXIME OPHTHALMIC INJECTION 1 MG/0.1 ML
INJECTION | OPHTHALMIC | Status: DC | PRN
  Administered 2021-11-10: 0.1 mL via INTRACAMERAL

## 2021-11-10 SURGICAL SUPPLY — 11 items
CANNULA ANT/CHMB 27G (MISCELLANEOUS) IMPLANT
CANNULA ANT/CHMB 27GA (MISCELLANEOUS) ×2 IMPLANT
GLOVE SRG 8 PF TXTR STRL LF DI (GLOVE) ×1 IMPLANT
GLOVE SURG ENC TEXT LTX SZ7.5 (GLOVE) ×2 IMPLANT
GLOVE SURG UNDER POLY LF SZ8 (GLOVE) ×1
LENS IOL TECNIS EYHANCE 23.5 (Intraocular Lens) ×1 IMPLANT
NDL FILTER BLUNT 18X1 1/2 (NEEDLE) ×1 IMPLANT
NEEDLE FILTER BLUNT 18X 1/2SAF (NEEDLE) ×1
NEEDLE FILTER BLUNT 18X1 1/2 (NEEDLE) ×1 IMPLANT
SYR 3ML LL SCALE MARK (SYRINGE) ×2 IMPLANT
WATER STERILE IRR 250ML POUR (IV SOLUTION) ×2 IMPLANT

## 2021-11-10 NOTE — H&P (Signed)
[facility]   Primary Care Physician:  Sherrie Mustache, MD Ophthalmologist: Dr. Lockie Mola  Pre-Procedure History & Physical: HPI:  Jocelyn Vazquez is a 75 y.o. female here for ophthalmic surgery.   Past Medical History:  Diagnosis Date   Diabetes mellitus without complication (HCC)    Hypertension    Kidney stones     Past Surgical History:  Procedure Laterality Date   ABDOMINAL HYSTERECTOMY  2002   BREAST BIOPSY Left 01/28/2016   breast and lymph US guided biopsy   CATARACT EXTRACTION W/PHACO Right 10/27/2021   Procedure: CATARACT EXTRACTION PHACO AND INTRAOCULAR LENS PLACEMENT (IOC) RIGHT DIABETIC 15.34 01:23.3;  Surgeon: Lockie Mola, MD;  Location[facility];  Service: Ophthalmology;  Laterality: Right;  Diabetic   COLONOSCOPY WITH PROPOFOL N/A 01/06/2016   Procedure: COLONOSCOPY WITH PROPOFOL;  Surgeon: Kieth Brightly, MD;  Location: [facility] ENDOSCOPY;  Service: Endoscopy;  Laterality: N/A;   COLONOSCOPY WITH PROPOFOL N/A 07/20/2020   Procedure: COLONOSCOPY WITH PROPOFOL;  Surgeon: Wyline Mood, MD;  Location: [facility] ENDOSCOPY;  Service: Gastroenterology;  Laterality: N/A;   ESOPHAGOGASTRODUODENOSCOPY (EGD) WITH PROPOFOL N/A 07/20/2020   Procedure: ESOPHAGOGASTRODUODENOSCOPY (EGD) WITH PROPOFOL;  Surgeon: Wyline Mood, MD;  Location: [facility] ENDOSCOPY;  Service: Gastroenterology;  Laterality: N/A;    Prior to Admission medications   Medication Sig Start Date End Date Taking? Authorizing Provider  aspirin 81 MG tablet Take 81 mg by mouth daily.   Yes [provider]  CALCIUM PO Take by mouth daily.   Yes [provider]  glipiZIDE (GLUCOTROL XL) 5 MG 24 hr tablet 10 mg 2 (two) times daily. 09/30/15  Yes [provider]  losartan-hydrochlorothiazide (HYZAAR) 100-25 MG tablet Take 1 tablet by mouth daily.   Yes [provider]  lovastatin (MEVACOR) 20 MG tablet TK 1 T PO QD 09/29/15  Yes [provider]  metFORMIN (GLUCOPHAGE) 1000 MG tablet 500 mg daily with supper. 09/29/15  Yes [provider]  sitaGLIPtin-metformin (JANUMET) 50-1000 MG tablet Take 1 tablet by mouth daily.   Yes [provider]    Allergies as of 09/29/2021   (No Known Allergies)    Family History  Problem Relation Age of Onset   Colon cancer Mother 69   Breast cancer Neg Hx     Social History   Socioeconomic History   Marital status: Single    Spouse name: Not on file   Number of children: Not on file   Years of education: Not on file   Highest education level: Not on file  Occupational History   Not on file  Tobacco Use   Smoking status: Never   Smokeless tobacco: Never  Vaping Use   Vaping Use: Never used  Substance and Sexual Activity   Alcohol use: No    Alcohol/week: 0.0 standard drinks   Drug use: No   Sexual activity: Not on file  Other Topics Concern   Not on file  Social History Narrative   Not on file   Social Determinants of Health   Financial Resource Strain: Not on file  Food Insecurity: Not on file  Transportation Needs: Not on file  Physical Activity: Not on file  Stress: Not on file  Social Connections: Not on file  Intimate Partner Violence: Not on file    Review of Systems: See HPI, otherwise negative ROS  Physical Exam: BP (!) 141/74    Pulse 79    Temp (!) 97.2 F (36.2 C)  Ht 4\' 11"  (1.499 m)    Wt 65.3 kg    SpO2 96%    BMI 29.08 kg/m  General:   Alert,  pleasant and cooperative in NAD Head:  Normocephalic and atraumatic. Lungs:  Clear to auscultation.    Heart:  Regular rate and rhythm.   Impression/Plan: is here for ophthalmic surgery.  Risks, benefits, limitations, and alternatives regarding ophthalmic surgery have been reviewed with the patient.  Questions have been answered.  All parties agreeable.   Jocelyn Caroli, MD  11/10/2021, 8:19 AM

## 2021-11-10 NOTE — Anesthesia Postprocedure Evaluation (Signed)
Anesthesia Post Note  Patient: Jocelyn Vazquez  Procedure(s) Performed: CATARACT EXTRACTION PHACO AND INTRAOCULAR LENS PLACEMENT (IOC) LEFT DIABETIC 15.95 01:42.4 (Left: Eye)     Patient location during evaluation: PACU Anesthesia Type: MAC Level of consciousness: awake and alert Pain management: pain level controlled Vital Signs Assessment: post-procedure vital signs reviewed and stable Respiratory status: spontaneous breathing, nonlabored ventilation, respiratory function stable and patient connected to nasal cannula oxygen Cardiovascular status: stable and blood pressure returned to baseline Postop Assessment: no apparent nausea or vomiting Anesthetic complications: no   No notable events documented.  Dawanna Grauberger A  Muskan Bolla

## 2021-11-10 NOTE — Transfer of Care (Signed)
Immediate Anesthesia Transfer of Care Note  Patient: Jocelyn Vazquez  Procedure(s) Performed: CATARACT EXTRACTION PHACO AND INTRAOCULAR LENS PLACEMENT (IOC) LEFT DIABETIC 15.95 01:42.4 (Left: Eye)  Patient Location: PACU  Anesthesia Type: MAC  Level of Consciousness: awake, alert  and patient cooperative  Airway and Oxygen Therapy: Patient Spontanous Breathing and Patient connected to supplemental oxygen  Post-op Assessment: Post-op Vital signs reviewed, Patient's Cardiovascular Status Stable, Respiratory Function Stable, Patent Airway and No signs of Nausea or vomiting  Post-op Vital Signs: Reviewed and stable  Complications: No notable events documented.

## 2021-11-10 NOTE — Anesthesia Preprocedure Evaluation (Signed)
Anesthesia Evaluation  Patient identified by MRN, date of birth, ID band Patient awake    Reviewed: Allergy & Precautions, NPO status , Patient's Chart, lab work & pertinent test results, reviewed documented beta blocker date and time   History of Anesthesia Complications Negative for: history of anesthetic complications  Airway Mallampati: II  TM Distance: >3 FB Neck ROM: Full    Dental   Pulmonary Current SmokerPatient did not abstain from smoking.,    breath sounds clear to auscultation       Cardiovascular hypertension, Pt. on medications and Pt. on home beta blockers (-) angina(-) DOE + dysrhythmias  Rhythm:Regular Rate:Normal     Neuro/Psych Anxiety    GI/Hepatic neg GERD  ,  Endo/Other  diabetesHypothyroidism   Renal/GU Renal disease (Stones)     Musculoskeletal   Abdominal   Peds  Hematology   Anesthesia Other Findings   Reproductive/Obstetrics                             Anesthesia Physical Anesthesia Plan  ASA: 2  Anesthesia Plan: MAC   Post-op Pain Management:    Induction: Intravenous  PONV Risk Score and Plan: 2 and TIVA, Midazolam and Treatment may vary due to age or medical condition  Airway Management Planned: Nasal Cannula  Additional Equipment:   Intra-op Plan:   Post-operative Plan:   Informed Consent: I have reviewed the patients History and Physical, chart, labs and discussed the procedure including the risks, benefits and alternatives for the proposed anesthesia with the patient or authorized representative who has indicated his/her understanding and acceptance.       Plan Discussed with: CRNA and Anesthesiologist  Anesthesia Plan Comments:         Anesthesia Quick Evaluation

## 2021-11-10 NOTE — Op Note (Signed)
OPERATIVE NOTE  Jocelyn Vazquez [id number] 11/10/2021   PREOPERATIVE DIAGNOSIS:  Nuclear sclerotic cataract left eye. H25.12   POSTOPERATIVE DIAGNOSIS:    Nuclear sclerotic cataract left eye.     PROCEDURE:  Phacoemusification with posterior chamber intraocular lens placement of the left eye  Ultrasound time: Procedure(s) with comments: CATARACT EXTRACTION PHACO AND INTRAOCULAR LENS PLACEMENT (IOC) LEFT DIABETIC 15.95 01:42.4 (Left) - Diabetic  LENS:   Implant Name Type Inv. Item Serial No. Manufacturer Lot No. LRB No. Used Action  LENS IOL TECNIS EYHANCE 23.5 - [id number] Intraocular Lens LENS IOL TECNIS EYHANCE 23.5 [id number] JOHNSON   Left 1 Implanted      SURGEON:  Deirdre Evener, MD   ANESTHESIA:  Topical with tetracaine drops and 2% Xylocaine jelly, augmented with 1% preservative-free intracameral lidocaine.    COMPLICATIONS:  None.   DESCRIPTION OF PROCEDURE:  The patient was identified in the holding room and transported to the operating room and placed in the supine position under the operating microscope.  The left eye was identified as the operative eye and it was prepped and draped in the usual sterile ophthalmic fashion.   A 1 millimeter clear-corneal paracentesis was made at the 1:30 position.  0.5 ml of preservative-free 1% lidocaine was injected into the anterior chamber.  The anterior chamber was filled with Viscoat viscoelastic.  A 2.4 millimeter keratome was used to make a near-clear corneal incision at the 10:30 position.  .  A curvilinear capsulorrhexis was made with a cystotome and capsulorrhexis forceps.  Balanced salt solution was used to hydrodissect and hydrodelineate the nucleus.   Phacoemulsification was then used in stop and chop fashion to remove the lens nucleus and epinucleus.  The remaining cortex was then removed using the irrigation and aspiration handpiece. Provisc was then placed into the capsular bag to distend it for lens  placement.  A lens was then injected into the capsular bag.  The remaining viscoelastic was aspirated.   Wounds were hydrated with balanced salt solution.  The anterior chamber was inflated to a physiologic pressure with balanced salt solution.  No wound leaks were noted. Cefuroxime 0.1 ml of a 10mg /ml solution was injected into the anterior chamber for a dose of 1 mg of intracameral antibiotic at the completion of the case.   Timolol and Brimonidine drops and Maxitrol ointment were applied to the eye.  The patient was taken to the recovery room in stable condition without complications of anesthesia or surgery.  [name] 11/10/2021, 9:13 AM

## 2021-11-11 ENCOUNTER — Encounter: Payer: Self-pay | Admitting: Ophthalmology

## 2022-07-29 ENCOUNTER — Other Ambulatory Visit: Payer: Self-pay | Admitting: Internal Medicine

## 2022-07-29 DIAGNOSIS — Z1231 Encounter for screening mammogram for malignant neoplasm of breast: Secondary | ICD-10-CM

## 2022-08-25 ENCOUNTER — Ambulatory Visit
Admission: RE | Admit: 2022-08-25 | Discharge: 2022-08-25 | Disposition: A | Discharge status: Home / Self Care | Payer: Medicare HMO | Source: Ambulatory Visit | Attending: Internal Medicine | Admitting: Internal Medicine

## 2022-08-25 DIAGNOSIS — Z1231 Encounter for screening mammogram for malignant neoplasm of breast: Secondary | ICD-10-CM | POA: Diagnosis present

## 2024-07-04 ENCOUNTER — Ambulatory Visit: Admitting: Internal Medicine

## 2024-07-23 ENCOUNTER — Ambulatory Visit: Admitting: Internal Medicine

## 2024-08-15 ENCOUNTER — Ambulatory Visit: Payer: Self-pay

## 2024-08-15 NOTE — Telephone Encounter (Signed)
   Copied from CRM [id number]. Topic: Clinical - Red Word Triage >> Aug 15, 2024  1:05 PM Delon HERO wrote: Red Word that prompted transfer to Nurse Triage: Patient grandson is calling to report the patient is unable to hear. Reporting that she has unable to hear in the left ear or 1 week- 1.5 week. Patient went to [place] and two days before she returned back to the states the hearing went away. Reason for Disposition  [1] Hearing loss in one or both ears AND [2] sudden onset AND [3] present now  Answer Assessment - Initial Assessment Questions Additional info: 1) Initial call from grandson to community line. Triaged with assistance from [employer] Interpreter Nita [phone].  2) Advised patient to contact her PCP office at [facility] to seek acute evaluation, if no response or office not open to proceed to walk in urgent care. Patient verbalized understanding and in agreement.    1. DESCRIPTION: What type of hearing problem are you having? Describe it for me. (e.g., complete hearing loss, partial loss)     Left lost hearing 2. LOCATION: One or both ears? If one, ask: Which ear?     Left  3. SEVERITY: Can you hear anything? If Yes, ask: What can you hear? (e.g., ticking watch, whisper, talking)     Very faint  4. ONSET: When did this begin? Did it start suddenly or come on gradually?     10 days ago 5. PATTERN: Does this come and go, or has it been constant since it started?     Constant  6. PAIN: Is there any pain in your ear(s)?  (Scale 0-10; or none, mild, moderate, severe)     Mild 7. CAUSE: What do you think is causing this hearing problem?     Unsure 8. OTHER SYMPTOMS: Do you have any other symptoms? (e.g., dizziness, ringing in ears)     denies  Protocols used: Hearing Loss or Change-A-AH
